# Patient Record
Sex: Female | Born: 2000 | Race: Black or African American | Hispanic: No | Marital: Single
Health system: Midwestern US, Community
[De-identification: ages and names within clinical notes are randomized; demographics above are authoritative.]

## PROBLEM LIST (undated history)

## (undated) ENCOUNTER — Inpatient Hospital Stay (HOSPITAL_COMMUNITY): Payer: Self-pay

## (undated) DIAGNOSIS — R519 Headache, unspecified: Secondary | ICD-10-CM

## (undated) DIAGNOSIS — O133 Gestational [pregnancy-induced] hypertension without significant proteinuria, third trimester: Secondary | ICD-10-CM

## (undated) DIAGNOSIS — O26893 Other specified pregnancy related conditions, third trimester: Secondary | ICD-10-CM

## (undated) DIAGNOSIS — J45909 Unspecified asthma, uncomplicated: Secondary | ICD-10-CM

## (undated) HISTORY — PX: WISDOM TOOTH EXTRACTION: SHX21

---
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
## 2018-02-06 NOTE — SANE Note (Signed)
ON 02/06/2018, AT APPROXIMATELY 1515 HOURS, I RECEIVED AN EMAIL FROM BRYAN (FROM THE [facility]), ADVISING THAT THE PT'S 23 DAY REGIMEN OF GENVOYA HAD BEEN RETURNED.    BRYAN FURTHER ADVISED THAT THE SHIPPING COMMENTS STATED:  "THE RECEIVER DOES NOT WANT THE PRODUCT AND REFUSED THE DELIVERY. /THE PACKAGE WILL BE RETURNED TO THE SENDER."  BRYAN ADVISED THAT THE CLAIM TO ADVANCING ACCESS & THE PT'S INSURANCE PLAN HAD BEEN REVERSED.

## 2018-06-30 NOTE — SANE Note (Signed)
06/30/2018. The patient file was requested by Detective DJ Benotti and was sent immediately after.

## 2018-11-29 ENCOUNTER — Inpatient Hospital Stay (HOSPITAL_COMMUNITY)
Admission: AD | Admit: 2018-11-29 | Discharge: 2018-11-29 | Disposition: A | Discharge status: Home / Self Care | Payer: PRIVATE HEALTH INSURANCE | Attending: Obstetrics and Gynecology | Admitting: Obstetrics and Gynecology

## 2018-11-29 ENCOUNTER — Other Ambulatory Visit: Payer: Self-pay

## 2018-11-29 ENCOUNTER — Encounter (HOSPITAL_COMMUNITY): Payer: Self-pay | Admitting: *Deleted

## 2018-11-29 DIAGNOSIS — R51 Headache: Secondary | ICD-10-CM | POA: Diagnosis present

## 2018-11-29 DIAGNOSIS — Z113 Encounter for screening for infections with a predominantly sexual mode of transmission: Secondary | ICD-10-CM | POA: Diagnosis not present

## 2018-11-29 DIAGNOSIS — N3001 Acute cystitis with hematuria: Secondary | ICD-10-CM | POA: Diagnosis not present

## 2018-11-29 DIAGNOSIS — R42 Dizziness and giddiness: Secondary | ICD-10-CM | POA: Diagnosis present

## 2018-11-29 DIAGNOSIS — Z3202 Encounter for pregnancy test, result negative: Secondary | ICD-10-CM | POA: Diagnosis not present

## 2018-11-29 DIAGNOSIS — R5383 Other fatigue: Secondary | ICD-10-CM | POA: Diagnosis present

## 2018-11-29 HISTORY — DX: Unspecified asthma, uncomplicated: J45.909

## 2018-11-29 LAB — CBC
HEMATOCRIT: 39.5 % (ref 36.0–46.0)
Hemoglobin: 12.6 g/dL (ref 12.0–15.0)
MCH: 26.5 pg (ref 26.0–34.0)
MCHC: 31.9 g/dL (ref 30.0–36.0)
MCV: 83 fL (ref 80.0–100.0)
NRBC: 0 % (ref 0.0–0.2)
PLATELETS: 212 10*3/uL (ref 150–400)
RBC: 4.76 MIL/uL (ref 3.87–5.11)
RDW: 16.7 % — AB (ref 11.5–15.5)
WBC: 7.6 10*3/uL (ref 4.0–10.5)

## 2018-11-29 LAB — URINALYSIS, ROUTINE W REFLEX MICROSCOPIC
BACTERIA UA: NONE SEEN
Bilirubin Urine: NEGATIVE
Glucose, UA: NEGATIVE mg/dL
Ketones, ur: NEGATIVE mg/dL
Nitrite: POSITIVE — AB
PROTEIN: 100 mg/dL — AB
SPECIFIC GRAVITY, URINE: 1.024 (ref 1.005–1.030)
WBC, UA: >50 WBC/hpf — ABNORMAL HIGH (ref 0–5)
pH: 6 (ref 5.0–8.0)

## 2018-11-29 LAB — WET PREP, GENITAL
Sperm: NONE SEEN
Trich, Wet Prep: NONE SEEN
Yeast Wet Prep HPF POC: NONE SEEN

## 2018-11-29 LAB — POCT PREGNANCY, URINE: PREG TEST UR: NEGATIVE

## 2018-11-29 MED ORDER — SULFAMETHOXAZOLE-TRIMETHOPRIM 800-160 MG PO TABS: 1.0000 | ORAL_TABLET | Freq: Two times a day (BID) | ORAL | 0 refills | Status: DC

## 2018-11-29 MED FILL — SULFAMETHOXAZOLE-TMP DS TAB: 800-160 | 3 days supply | Qty: 6 | Fill #0

## 2018-11-29 NOTE — MAU Note (Signed)
Missed period for 11 days.  Is feeling really really weak.  Has had nausea that wakes her out of her sleep. Has had really really bad headaches.  Did a test prior to missing period, it was neg.

## 2018-11-29 NOTE — MAU Note (Signed)
Discussed discharge and follow up with PCP or GYN for future care. Signature pad not working and d/c signature paper didn't print. Pt verbalized understanding of d/c and follow up.

## 2018-11-29 NOTE — Discharge Instructions (Signed)

## 2018-11-29 NOTE — MAU Provider Note (Signed)
Chief Complaint: Possible Pregnancy; Dizziness; Fatigue; and Headache   First Provider Initiated Contact with Patient 11/29/18 1412     SUBJECTIVE HPI: Leslie Schmidt is a 17 y.o. non pregnant patient who presents to Maternity Admissions reporting abdominal cramping, nausea, & amenorrhea. Was on OCPs but discontinued them last month. States per her APP, she is 11 days late for her period. Is sexually active with 1 partner x 3 months & does not routinely use condoms.  She reports increased urinary frequency & suprapubic pain for the last several days. Denies dysuria, fever, flank pain, or hematuria. Denies vaginal discharge, dyspareunia, or postcoital bleeding.  She started spotting when she arrived to MAU and thinks she may have started her period just now. Reports nausea & headaches and thinks it's due to starting her period.    Location: suprapubic Quality: cramping Severity: 5/10 on pain scale Duration: 3 days Timing: intermittent Modifying factors: nothing makes better or worse Associated signs and symptoms: nausea  Past Medical History:  Diagnosis Date  . Asthma    OB History  No obstetric history on file.   Past Surgical History:  Procedure Laterality Date  . WISDOM TOOTH EXTRACTION     Social History   Socioeconomic History  . Marital status: Single    Spouse name: Not on file  . Number of children: Not on file  . Years of education: Not on file  . Highest education level: Not on file  Occupational History  . Not on file  Social Needs  . Financial resource strain: Not on file  . Food insecurity:    Worry: Not on file    Inability: Not on file  . Transportation needs:    Medical: Not on file    Non-medical: Not on file  Tobacco Use  . Smoking status: Never Smoker  . Smokeless tobacco: Never Used  Substance and Sexual Activity  . Alcohol use: No    Frequency: Never  . Drug use: Not Currently    Types: Marijuana  . Sexual activity: Yes    Birth  control/protection: Condom    Comment: stopped taking BCP, not consistant with condoms  Lifestyle  . Physical activity:    Days per week: Not on file    Minutes per session: Not on file  . Stress: Not on file  Relationships  . Social connections:    Talks on phone: Not on file    Gets together: Not on file    Attends religious service: Not on file    Active member of club or organization: Not on file    Attends meetings of clubs or organizations: Not on file    Relationship status: Not on file  . Intimate partner violence:    Fear of current or ex partner: Not on file    Emotionally abused: Not on file    Physically abused: Not on file    Forced sexual activity: Not on file  Other Topics Concern  . Not on file  Social History Narrative  . Not on file   No family history on file. No current facility-administered medications on file prior to encounter.    No current outpatient medications on file prior to encounter.   No Known Allergies  I have reviewed patient's Past Medical Hx, Surgical Hx, Family Hx, Social Hx, medications and allergies.   Review of Systems  Constitutional: Negative.   Gastrointestinal: Positive for abdominal pain and nausea. Negative for constipation, diarrhea and vomiting.  Genitourinary: Positive for frequency,  menstrual problem and urgency. Negative for dyspareunia, dysuria, flank pain, hematuria and vaginal discharge.  Neurological: Positive for headaches.    OBJECTIVE Patient Vitals for the past 24 hrs:  BP Temp Temp src Pulse Resp SpO2 Height Weight  11/29/18 1542 123/74 - - 69 16 - - -  11/29/18 1330 125/82 98.4 F (36.9 C) Oral 77 16 100 % 5\' 4"  (1.626 m) 58.2 kg   Constitutional: Well-developed, well-nourished female in no acute distress.  Cardiovascular: normal rate & rhythm, no murmur Respiratory: normal rate and effort. Lung sounds clear throughout GI: Abd soft, non-tender, Pos BS x 4. No guarding or rebound tenderness MS: Extremities  nontender, no edema, normal ROM Neurologic: Alert and oriented x 4.  GU:     SPECULUM EXAM: NEFG, small amount of dark red blood. Cervix pink/smooth  BIMANUAL: No CMT. uterus normal size, no adnexal tenderness or masses.    LAB RESULTS Results for orders placed or performed during the hospital encounter of 11/29/18 (from the past 24 hour(s))  Urinalysis, Routine w reflex microscopic     Status: Abnormal   Collection Time: 11/29/18  1:56 PM  Result Value Ref Range   Color, Urine YELLOW YELLOW   APPearance CLOUDY (A) CLEAR   Specific Gravity, Urine 1.024 1.005 - 1.030   pH 6.0 5.0 - 8.0   Glucose, UA NEGATIVE NEGATIVE mg/dL   Hgb urine dipstick SMALL (A) NEGATIVE   Bilirubin Urine NEGATIVE NEGATIVE   Ketones, ur NEGATIVE NEGATIVE mg/dL   Protein, ur 161100 (A) NEGATIVE mg/dL   Nitrite POSITIVE (A) NEGATIVE   Leukocytes, UA LARGE (A) NEGATIVE   RBC / HPF 11-20 0 - 5 RBC/hpf   WBC, UA >50 (H) 0 - 5 WBC/hpf   Bacteria, UA NONE SEEN NONE SEEN   Squamous Epithelial / LPF 0-5 0 - 5   WBC Clumps PRESENT    Mucus PRESENT   Pregnancy, urine POC     Status: None   Collection Time: 11/29/18  2:00 PM  Result Value Ref Range   Preg Test, Ur NEGATIVE NEGATIVE  CBC     Status: Abnormal   Collection Time: 11/29/18  2:36 PM  Result Value Ref Range   WBC 7.6 4.0 - 10.5 K/uL   RBC 4.76 3.87 - 5.11 MIL/uL   Hemoglobin 12.6 12.0 - 15.0 g/dL   HCT 09.639.5 04.536.0 - 40.946.0 %   MCV 83.0 80.0 - 100.0 fL   MCH 26.5 26.0 - 34.0 pg   MCHC 31.9 30.0 - 36.0 g/dL   RDW 81.116.7 (H) 91.411.5 - 78.215.5 %   Platelets 212 150 - 400 K/uL   nRBC 0.0 0.0 - 0.2 %  Wet prep, genital     Status: Abnormal   Collection Time: 11/29/18  3:12 PM  Result Value Ref Range   Yeast Wet Prep HPF POC NONE SEEN NONE SEEN   Trich, Wet Prep NONE SEEN NONE SEEN   Clue Cells Wet Prep HPF POC PRESENT (A) NONE SEEN   WBC, Wet Prep HPF POC MODERATE (A) NONE SEEN   Sperm NONE SEEN     IMAGING No results found.  MAU COURSE Orders Placed This  Encounter  Procedures  . Wet prep, genital  . Urinalysis, Routine w reflex microscopic  . RPR  . HIV Antibody (routine testing w rflx)  . CBC  . Pregnancy, urine POC  . Discharge patient   Meds ordered this encounter  Medications  . sulfamethoxazole-trimethoprim (BACTRIM DS,SEPTRA DS) 800-160 MG tablet  Sig: Take 1 tablet by mouth 2 (two) times daily.    Dispense:  6 tablet    Refill:  0    Order Specific Question:   Supervising Provider    Answer:   Samara SnidePRATT, TANYA S [[id number]]    MDM UPT negative STD screening per patient U/a suspicious for UTI & pt with new onset suprapubic pain & urinary frequency. Pt afebrile and no CVAT.   ASSESSMENT 1. Acute cystitis with hematuria   2. Negative pregnancy test   3. Screen for STD (sexually transmitted disease)     PLAN Discharge home in stable condition. Rx bactrim GC/CT, HIV, RPR pending Establish with PCP   Allergies as of 11/29/2018   No Known Allergies     Medication List    STOP taking these medications   elvitegravir-cobicistat-emtricitabine-tenofovir 150-150-200-10 MG Tabs tablet Commonly known as:  GENVOYA     TAKE these medications   sulfamethoxazole-trimethoprim 800-160 MG tablet Commonly known as:  BACTRIM DS,SEPTRA DS Take 1 tablet by mouth 2 (two) times daily.        Judeth HornLawrence, Christien Frankl, NP 11/29/2018  6:16 PM

## 2018-11-29 NOTE — Unmapped (Signed)
Chief Complaint: Possible Pregnancy; Dizziness; Fatigue; and Headache   First Provider Initiated Contact with Patient 11/29/18 1412     SUBJECTIVE HPI: Kylie [name] is a 17 y.o. non pregnant patient who presents to [facility] reporting abdominal cramping, nausea, & amenorrhea. Was on OCPs but discontinued them last month. States per her APP, she is 11 days late for her period. Is sexually active with 1 partner x 3 months & does not routinely use condoms.  She reports increased urinary frequency & suprapubic pain for the last several days. Denies dysuria, fever, flank pain, or hematuria. Denies vaginal discharge, dyspareunia, or postcoital bleeding.  She started spotting when she arrived to [facility] and thinks she may have started her period just now. Reports nausea & headaches and thinks it's due to starting her period.    Location: suprapubic Quality: cramping Severity: 5/10 on pain scale Duration: 3 days Timing: intermittent Modifying factors: nothing makes better or worse Associated signs and symptoms: nausea  Past Medical History:  Diagnosis Date  . Asthma    OB History  No obstetric history on file.   Past Surgical History:  Procedure Laterality Date  . WISDOM TOOTH EXTRACTION     Social History   Socioeconomic History  . Marital status: Single    Spouse name: Not on file  . Number of children: Not on file  . Years of education: Not on file  . Highest education level: Not on file  Occupational History  . Not on file  Social Needs  . Financial resource strain: Not on file  . Food insecurity:    Worry: Not on file    Inability: Not on file  . Transportation needs:    Medical: Not on file    Non-medical: Not on file  Tobacco Use  . Smoking status: Never Smoker  . Smokeless tobacco: Never Used  Substance and Sexual Activity  . Alcohol use: No    Frequency: Never  . Drug use: Not Currently    Types: Marijuana  . Sexual activity: Yes    Birth  control/protection: Condom    Comment: stopped taking BCP, not consistant with condoms  Lifestyle  . Physical activity:    Days per week: Not on file    Minutes per session: Not on file  . Stress: Not on file  Relationships  . Social connections:    Talks on phone: Not on file    Gets together: Not on file    Attends religious service: Not on file    Active member of club or organization: Not on file    Attends meetings of clubs or organizations: Not on file    Relationship status: Not on file  . Intimate partner violence:    Fear of current or ex partner: Not on file    Emotionally abused: Not on file    Physically abused: Not on file    Forced sexual activity: Not on file  Other Topics Concern  . Not on file  Social History Narrative  . Not on file   No family history on file. No current facility-administered medications on file prior to encounter.    No current outpatient medications on file prior to encounter.   No Known Allergies  I have reviewed patient's Past Medical Hx, Surgical Hx, Family Hx, Social Hx, medications and allergies.   Review of Systems  Constitutional: Negative.   Gastrointestinal: Positive for abdominal pain and nausea. Negative for constipation, diarrhea and vomiting.  Genitourinary: Positive for frequency,  menstrual problem and urgency. Negative for dyspareunia, dysuria, flank pain, hematuria and vaginal discharge.  Neurological: Positive for headaches.    OBJECTIVE Patient Vitals for the past 24 hrs:  BP Temp Temp src Pulse Resp SpO2 Height Weight  11/29/18 1542 123/74 - - 69 16 - - -  11/29/18 1330 125/82 98.4 F (36.9 C) Oral 77 16 100 % 5\' 4"  (1.626 m) 58.2 kg   Constitutional: Well-developed, well-nourished female in no acute distress.  Cardiovascular: normal rate & rhythm, no murmur Respiratory: normal rate and effort. Lung sounds clear throughout GI: Abd soft, non-tender, Pos BS x 4. No guarding or rebound tenderness MS: Extremities  nontender, no edema, normal ROM Neurologic: Alert and oriented x 4.  GU:     SPECULUM EXAM: NEFG, small amount of dark red blood. Cervix pink/smooth  BIMANUAL: No CMT. uterus normal size, no adnexal tenderness or masses.    LAB RESULTS Results for orders placed or performed during the hospital encounter of 11/29/18 (from the past 24 hour(s))  Urinalysis, Routine w reflex microscopic     Status: Abnormal   Collection Time: 11/29/18  1:56 PM  Result Value Ref Range   Color, Urine YELLOW YELLOW   APPearance CLOUDY (A) CLEAR   Specific Gravity, Urine 1.024 1.005 - 1.030   pH 6.0 5.0 - 8.0   Glucose, UA NEGATIVE NEGATIVE mg/dL   Hgb urine dipstick SMALL (A) NEGATIVE   Bilirubin Urine NEGATIVE NEGATIVE   Ketones, ur NEGATIVE NEGATIVE mg/dL   Protein, ur 161100 (A) NEGATIVE mg/dL   Nitrite POSITIVE (A) NEGATIVE   Leukocytes, UA LARGE (A) NEGATIVE   RBC / HPF 11-20 0 - 5 RBC/hpf   WBC, UA >50 (H) 0 - 5 WBC/hpf   Bacteria, UA NONE SEEN NONE SEEN   Squamous Epithelial / LPF 0-5 0 - 5   WBC Clumps PRESENT    Mucus PRESENT   Pregnancy, urine POC     Status: None   Collection Time: 11/29/18  2:00 PM  Result Value Ref Range   Preg Test, Ur NEGATIVE NEGATIVE  CBC     Status: Abnormal   Collection Time: 11/29/18  2:36 PM  Result Value Ref Range   WBC 7.6 4.0 - 10.5 K/uL   RBC 4.76 3.87 - 5.11 MIL/uL   Hemoglobin 12.6 12.0 - 15.0 g/dL   HCT 09.639.5 04.536.0 - 40.946.0 %   MCV 83.0 80.0 - 100.0 fL   MCH 26.5 26.0 - 34.0 pg   MCHC 31.9 30.0 - 36.0 g/dL   RDW 81.116.7 (H) 91.411.5 - 78.215.5 %   Platelets 212 150 - 400 K/uL   nRBC 0.0 0.0 - 0.2 %  Wet prep, genital     Status: Abnormal   Collection Time: 11/29/18  3:12 PM  Result Value Ref Range   Yeast Wet Prep HPF POC NONE SEEN NONE SEEN   Trich, Wet Prep NONE SEEN NONE SEEN   Clue Cells Wet Prep HPF POC PRESENT (A) NONE SEEN   WBC, Wet Prep HPF POC MODERATE (A) NONE SEEN   Sperm NONE SEEN     IMAGING No results found.  MAU COURSE Orders Placed This  Encounter  Procedures  . Wet prep, genital  . Urinalysis, Routine w reflex microscopic  . RPR  . HIV Antibody (routine testing w rflx)  . CBC  . Pregnancy, urine POC  . Discharge patient   Meds ordered this encounter  Medications  . sulfamethoxazole-trimethoprim (BACTRIM DS,SEPTRA DS) 800-160 MG tablet  Sig: Take 1 tablet by mouth 2 (two) times daily.    Dispense:  6 tablet    Refill:  0    Order Specific Question:   Supervising Provider    Answer:   Samara SnidePRATT, TANYA S [[id number]]    MDM UPT negative STD screening per patient U/a suspicious for UTI & pt with new onset suprapubic pain & urinary frequency. Pt afebrile and no CVAT.   ASSESSMENT 1. Acute cystitis with hematuria   2. Negative pregnancy test   3. Screen for STD (sexually transmitted disease)     PLAN Discharge home in stable condition. Rx bactrim GC/CT, HIV, RPR pending Establish with PCP   Allergies as of 11/29/2018   No Known Allergies     Medication List    STOP taking these medications   elvitegravir-cobicistat-emtricitabine-tenofovir 150-150-200-10 MG Tabs tablet Commonly known as:  GENVOYA     TAKE these medications   sulfamethoxazole-trimethoprim 800-160 MG tablet Commonly known as:  BACTRIM DS,SEPTRA DS Take 1 tablet by mouth 2 (two) times daily.        Judeth HornLawrence, Erin, NP 11/29/2018  6:16 PM

## 2018-11-30 LAB — HIV ANTIBODY (ROUTINE TESTING W REFLEX): HIV Screen 4th Generation wRfx: NONREACTIVE

## 2018-11-30 LAB — RPR: RPR Ser Ql: NONREACTIVE

## 2018-12-02 LAB — GC/CHLAMYDIA PROBE AMP (~~LOC~~) NOT AT ARMC
Chlamydia: POSITIVE — AB
NEISSERIA GONORRHEA: NEGATIVE

## 2018-12-03 ENCOUNTER — Telehealth: Payer: Self-pay | Admitting: Student

## 2018-12-03 DIAGNOSIS — A749 Chlamydial infection, unspecified: Secondary | ICD-10-CM

## 2018-12-03 MED ORDER — AZITHROMYCIN 500 MG PO TABS: 1000.0000 mg | ORAL_TABLET | Freq: Once | ORAL | 0 refills | Status: AC

## 2018-12-03 MED FILL — AZITHROMYCIN 500 MG TABLET: 500 | 1 days supply | Qty: 2 | Fill #0

## 2018-12-03 NOTE — Telephone Encounter (Addendum)
Leslie Schmidt tested positive for  Chlamydia. Patient was called by RN and allergies and pharmacy confirmed. Rx sent to pharmacy of choice.   Judeth HornLawrence, Sheneka Schrom, NP 12/03/2018 1:06 PM       ----- Message from Kathe BectonLori S Berdik, RN sent at 12/02/2018  5:15 PM EST ----- This patient tested positive for :  CHLAMYDIA  She "has NKDA", I have informed the patient of her results and confirmed her pharmacy is correct in her chart. Please send Rx.   Thank you,   Kathe BectonBerdik, Lori S, RN   [facility]

## 2019-02-26 ENCOUNTER — Inpatient Hospital Stay (HOSPITAL_COMMUNITY)
Admission: AD | Admit: 2019-02-26 | Discharge: 2019-02-26 | Disposition: A | Discharge status: Home / Self Care | Payer: Medicaid - Out of State | Attending: Obstetrics and Gynecology | Admitting: Obstetrics and Gynecology

## 2019-02-26 ENCOUNTER — Other Ambulatory Visit: Payer: Self-pay

## 2019-02-26 ENCOUNTER — Encounter (HOSPITAL_COMMUNITY): Payer: Self-pay | Admitting: Student

## 2019-02-26 ENCOUNTER — Inpatient Hospital Stay (HOSPITAL_COMMUNITY): Payer: Medicaid - Out of State

## 2019-02-26 DIAGNOSIS — O219 Vomiting of pregnancy, unspecified: Secondary | ICD-10-CM | POA: Insufficient documentation

## 2019-02-26 DIAGNOSIS — O99611 Diseases of the digestive system complicating pregnancy, first trimester: Secondary | ICD-10-CM

## 2019-02-26 DIAGNOSIS — R109 Unspecified abdominal pain: Secondary | ICD-10-CM

## 2019-02-26 DIAGNOSIS — K5900 Constipation, unspecified: Secondary | ICD-10-CM | POA: Insufficient documentation

## 2019-02-26 DIAGNOSIS — R1030 Lower abdominal pain, unspecified: Secondary | ICD-10-CM | POA: Insufficient documentation

## 2019-02-26 DIAGNOSIS — O3680X0 Pregnancy with inconclusive fetal viability, not applicable or unspecified: Secondary | ICD-10-CM

## 2019-02-26 DIAGNOSIS — Z3A01 Less than 8 weeks gestation of pregnancy: Secondary | ICD-10-CM

## 2019-02-26 DIAGNOSIS — O26891 Other specified pregnancy related conditions, first trimester: Secondary | ICD-10-CM

## 2019-02-26 DIAGNOSIS — J45909 Unspecified asthma, uncomplicated: Secondary | ICD-10-CM | POA: Insufficient documentation

## 2019-02-26 DIAGNOSIS — Z79899 Other long term (current) drug therapy: Secondary | ICD-10-CM | POA: Insufficient documentation

## 2019-02-26 LAB — URINALYSIS, ROUTINE W REFLEX MICROSCOPIC
BILIRUBIN URINE: NEGATIVE
Glucose, UA: NEGATIVE mg/dL
Hgb urine dipstick: NEGATIVE
Ketones, ur: NEGATIVE mg/dL
LEUKOCYTE UA: NEGATIVE
NITRITE: NEGATIVE
Protein, ur: NEGATIVE mg/dL
Specific Gravity, Urine: 1.023 (ref 1.005–1.030)
pH: 7 (ref 5.0–8.0)

## 2019-02-26 LAB — WET PREP, GENITAL
Sperm: NONE SEEN
Trich, Wet Prep: NONE SEEN
Yeast Wet Prep HPF POC: NONE SEEN

## 2019-02-26 LAB — HCG, QUANTITATIVE, PREGNANCY: hCG, Beta Chain, Quant, S: 4802 m[IU]/mL — ABNORMAL HIGH (ref <5)

## 2019-02-26 LAB — CBC
HCT: 36.5 % (ref 36.0–46.0)
Hemoglobin: 11.7 g/dL — ABNORMAL LOW (ref 12.0–15.0)
MCH: 27.5 pg (ref 26.0–34.0)
MCHC: 32.1 g/dL (ref 30.0–36.0)
MCV: 85.9 fL (ref 80.0–100.0)
Platelets: 209 10*3/uL (ref 150–400)
RBC: 4.25 MIL/uL (ref 3.87–5.11)
RDW: 13.9 % (ref 11.5–15.5)
WBC: 6.8 10*3/uL (ref 4.0–10.5)
nRBC: 0 % (ref 0.0–0.2)

## 2019-02-26 LAB — ABO/RH: ABO/RH(D): O POS

## 2019-02-26 LAB — POCT PREGNANCY, URINE: Preg Test, Ur: POSITIVE — AB

## 2019-02-26 MED ORDER — METOCLOPRAMIDE HCL 10 MG PO TABS: 10.0000 mg | ORAL_TABLET | Freq: Three times a day (TID) | ORAL | 0 refills | Status: AC | PRN

## 2019-02-26 MED FILL — METOCLOPRAMIDE 10 MG TABLET: 10 | 10 days supply | Qty: 30 | Fill #0

## 2019-02-26 NOTE — Discharge Instructions (Signed)
Constipation, Adult Constipation is when a person has fewer bowel movements in a week than normal, has difficulty having a bowel movement, or has stools that are dry, hard, or larger than normal. Constipation may be caused by an underlying condition. It may become worse with age if a person takes certain medicines and does not take in enough fluids. Follow these instructions at home: Eating and drinking   Eat foods that have a lot of fiber, such as fresh fruits and vegetables, whole grains, and beans.  Limit foods that are high in fat, low in fiber, or overly processed, such as french fries, hamburgers, cookies, candies, and soda.  Drink enough fluid to keep your urine clear or pale yellow. General instructions  Exercise regularly or as told by your health care provider.  Go to the restroom when you have the urge to go. Do not hold it in.  Take over-the-counter and prescription medicines only as told by your health care provider. These include any fiber supplements.  Practice pelvic floor retraining exercises, such as deep breathing while relaxing the lower abdomen and pelvic floor relaxation during bowel movements.  Watch your condition for any changes.  Keep all follow-up visits as told by your health care provider. This is important. Contact a health care provider if:  You have pain that gets worse.  You have a fever.  You do not have a bowel movement after 4 days.  You vomit.  You are not hungry.  You lose weight.  You are bleeding from the anus.  You have thin, pencil-like stools. Get help right away if:  You have a fever and your symptoms suddenly get worse.  You leak stool or have blood in your stool.  Your abdomen is bloated.  You have severe pain in your abdomen.  You feel dizzy or you faint. This information is not intended to replace advice given to you by your health care provider. Make sure you discuss any questions you have with your health care  provider. Document Released: 08/25/2004 Document Revised: 06/16/2016 Document Reviewed: 05/17/2016 Elsevier Interactive Patient Education  2019 [employer] Morning Sickness  Morning sickness is when a woman feels nauseous during pregnancy. This nauseous feeling may or may not come with vomiting. It often occurs in the morning, but it can be a problem at any time of day. Morning sickness is most common during the first trimester. In some cases, it may continue throughout pregnancy. Although morning sickness is unpleasant, it is usually harmless unless the woman develops severe and continual vomiting (hyperemesis gravidarum), a condition that requires more intense treatment. What are the causes? The exact cause of this condition is not known, but it seems to be related to normal hormonal changes that occur in pregnancy. What increases the risk? You are more likely to develop this condition if:  You experienced nausea or vomiting before your pregnancy.  You had morning sickness during a previous pregnancy.  You are pregnant with more than one baby, such as twins. What are the signs or symptoms? Symptoms of this condition include:  Nausea.  Vomiting. How is this diagnosed? This condition is usually diagnosed based on your signs and symptoms. How is this treated? In many cases, treatment is not needed for this condition. Making some changes to what you eat may help to control symptoms. Your health care provider may also prescribe or recommend:  Vitamin B6 supplements.  Anti-nausea medicines.  Ginger. Follow these instructions at home: Medicines  Take over-the-counter and  prescription medicines only as told by your health care provider. Do not use any prescription, over-the-counter, or herbal medicines for morning sickness without first talking with your health care provider.  Taking multivitamins before getting pregnant can prevent or decrease the severity of morning sickness in  most women. Eating and drinking  Eat a piece of dry toast or crackers before getting out of bed in the morning.  Eat 5 or 6 small meals a day.  Eat dry and bland foods, such as rice or a baked potato. Foods that are high in carbohydrates are often helpful.  Avoid greasy, fatty, and spicy foods.  Have someone cook for you if the smell of any food causes nausea and vomiting.  If you feel nauseous after taking prenatal vitamins, take the vitamins at night or with a snack.  Snack on protein foods between meals if you are hungry. Nuts, yogurt, and cheese are good options.  Drink fluids throughout the day.  Try ginger ale made with real ginger, ginger tea made from fresh grated ginger, or ginger candies. General instructions  Do not use any products that contain nicotine or tobacco, such as cigarettes and e-cigarettes. If you need help quitting, ask your health care provider.  Get an air purifier to keep the air in your house free of odors.  Get plenty of fresh air.  Try to avoid odors that trigger your nausea.  Consider trying these methods to help relieve symptoms: ? Wearing an acupressure wristband. These wristbands are often worn for seasickness. ? Acupuncture. Contact a health care provider if:  Your home remedies are not working and you need medicine.  You feel dizzy or light-headed.  You are losing weight. Get help right away if:  You have persistent and uncontrolled nausea and vomiting.  You faint.  You have severe pain in your abdomen. Summary  Morning sickness is when a woman feels nauseous during pregnancy. This nauseous feeling may or may not come with vomiting.  Morning sickness is most common during the first trimester.  It often occurs in the morning, but it can be a problem at any time of day.  In many cases, treatment is not needed for this condition. Making some changes to what you eat may help to control symptoms. This information is not intended  to replace advice given to you by your health care provider. Make sure you discuss any questions you have with your health care provider. Document Released: 01/18/2007 Document Revised: 12/30/2016 Document Reviewed: 12/30/2016 Elsevier Interactive Patient Education  2019 [employer]      Safe Medications in Pregnancy   Acne: Benzoyl Peroxide Salicylic Acid  Backache/Headache: Tylenol: 2 regular strength every 4 hours OR              2 Extra strength every 6 hours  Colds/Coughs/Allergies: Benadryl (alcohol free) 25 mg every 6 hours as needed Breath right strips Claritin Cepacol throat lozenges Chloraseptic throat spray Cold-Eeze- up to three times per day Cough drops, alcohol free Flonase (by prescription only) Guaifenesin Mucinex Robitussin DM (plain only, alcohol free) Saline nasal spray/drops Sudafed (pseudoephedrine) & Actifed ** use only after [redacted] weeks gestation and if you do not have high blood pressure Tylenol Vicks Vaporub Zinc lozenges Zyrtec   Constipation: Colace Ducolax suppositories Fleet enema Glycerin suppositories Metamucil Milk of magnesia Miralax Senokot Smooth move tea  Diarrhea: Kaopectate Imodium A-D  *NO pepto Bismol  Hemorrhoids: Anusol Anusol HC Preparation H Tucks  Indigestion: Tums Maalox Mylanta Zantac  Pepcid  Insomnia: Benadryl (alcohol free) 25mg  every 6 hours as needed Tylenol PM Unisom, no Gelcaps  Leg Cramps: Tums MagGel  Nausea/Vomiting:  Bonine Dramamine Emetrol Ginger extract Sea bands Meclizine  Nausea medication to take during pregnancy:  Unisom (doxylamine succinate 25 mg tablets) Take one tablet daily at bedtime. If symptoms are not adequately controlled, the dose can be increased to a maximum recommended dose of two tablets daily (1/2 tablet in the morning, 1/2 tablet mid-afternoon and one at bedtime). Vitamin B6 100mg  tablets. Take one tablet twice a day (up to 200 mg per day).  Skin  Rashes: Aveeno products Benadryl cream or 25mg  every 6 hours as needed Calamine Lotion 1% cortisone cream  Yeast infection: Gyne-lotrimin 7 Monistat 7  Gum/tooth pain: Anbesol  **If taking multiple medications, please check labels to avoid duplicating the same active ingredients **take medication as directed on the label ** Do not exceed 4000 mg of tylenol in 24 hours **Do not take medications that contain aspirin or ibuprofen

## 2019-02-26 NOTE — MAU Provider Note (Signed)
Chief Complaint: Nausea and Constipation   First Provider Initiated Contact with Patient 02/26/19 1221     SUBJECTIVE HPI: Leslie Schmidt is a 18 y.o. G1P0 at [redacted]w[redacted]d who presents to Maternity Admissions reporting n/v, constipation, & abdominal pain. Reports daily nausea, vomited once yesterday. Does not have nausea meds at home. Also reports issues with constipation. Had not had a BM in the last 4 days until this morning. Had not taking anything to tx her constipation but did eat ice cream because she is lactose intolerant.  Reports intermittent lower abdominal pain for the last several days. Denies dysuria, vaginal discharge, or vaginal bleeding.   Location: lower abdomen Quality: cramping Severity: 4/10 on pain scale Duration: 4 days Timing: intermittent Modifying factors: none Associated signs and symptoms: n/v, constipation  Past Medical History:  Diagnosis Date  . Asthma    OB History  Gravida Para Term Preterm AB Living  1            SAB TAB Ectopic Multiple Live Births               # Outcome Date GA Lbr Len/2nd Weight Sex Delivery Anes PTL Lv  1 Current            Past Surgical History:  Procedure Laterality Date  . WISDOM TOOTH EXTRACTION     Social History   Socioeconomic History  . Marital status: Single    Spouse name: Not on file  . Number of children: Not on file  . Years of education: Not on file  . Highest education level: Not on file  Occupational History  . Not on file  Social Needs  . Financial resource strain: Not on file  . Food insecurity:    Worry: Not on file    Inability: Not on file  . Transportation needs:    Medical: Not on file    Non-medical: Not on file  Tobacco Use  . Smoking status: Never Smoker  . Smokeless tobacco: Never Used  Substance and Sexual Activity  . Alcohol use: Not Currently    Frequency: Never    Comment: not since finding out she was pregnant  . Drug use: Not Currently    Types: Marijuana    Comment: last use summer  2019  . Sexual activity: Yes    Comment: stopped taking BCP, not consistant with condoms  Lifestyle  . Physical activity:    Days per week: Not on file    Minutes per session: Not on file  . Stress: Not on file  Relationships  . Social connections:    Talks on phone: Not on file    Gets together: Not on file    Attends religious service: Not on file    Active member of club or organization: Not on file    Attends meetings of clubs or organizations: Not on file    Relationship status: Not on file  . Intimate partner violence:    Fear of current or ex partner: Not on file    Emotionally abused: Not on file    Physically abused: Not on file    Forced sexual activity: Not on file  Other Topics Concern  . Not on file  Social History Narrative  . Not on file   History reviewed. No pertinent family history. No current facility-administered medications on file prior to encounter.    Current Outpatient Medications on File Prior to Encounter  Medication Sig Dispense Refill  . sulfamethoxazole-trimethoprim (BACTRIM DS,SEPTRA DS)  800-160 MG tablet Take 1 tablet by mouth 2 (two) times daily. 6 tablet 0   No Known Allergies  I have reviewed patient's Past Medical Hx, Surgical Hx, Family Hx, Social Hx, medications and allergies.   Review of Systems  Constitutional: Negative.   Gastrointestinal: Positive for abdominal pain, constipation, nausea and vomiting.  Genitourinary: Negative.     OBJECTIVE Patient Vitals for the past 24 hrs:  BP Temp Temp src Pulse Resp SpO2 Height Weight  02/26/19 1141 119/68 98.2 F (36.8 C) Oral 85 18 100 % - -  02/26/19 1100 112/71 97.9 F (36.6 C) Oral 90 18 100 % 5\' 3"  (1.6 m) 60.8 kg   Constitutional: Well-developed, well-nourished female in no acute distress.  Cardiovascular: normal rate & rhythm, no murmur Respiratory: normal rate and effort. Lung sounds clear throughout GI: Abd soft, non-tender, Pos BS x 4. No guarding or rebound  tenderness MS: Extremities nontender, no edema, normal ROM Neurologic: Alert and oriented x 4.    LAB RESULTS Results for orders placed or performed during the hospital encounter of 02/26/19 (from the past 24 hour(s))  Urinalysis, Routine w reflex microscopic     Status: Abnormal   Collection Time: 02/26/19 11:10 AM  Result Value Ref Range   Color, Urine YELLOW YELLOW   APPearance HAZY (A) CLEAR   Specific Gravity, Urine 1.023 1.005 - 1.030   pH 7.0 5.0 - 8.0   Glucose, UA NEGATIVE NEGATIVE mg/dL   Hgb urine dipstick NEGATIVE NEGATIVE   Bilirubin Urine NEGATIVE NEGATIVE   Ketones, ur NEGATIVE NEGATIVE mg/dL   Protein, ur NEGATIVE NEGATIVE mg/dL   Nitrite NEGATIVE NEGATIVE   Leukocytes,Ua NEGATIVE NEGATIVE  Pregnancy, urine POC     Status: Abnormal   Collection Time: 02/26/19 11:11 AM  Result Value Ref Range   Preg Test, Ur POSITIVE (A) NEGATIVE  Wet prep, genital     Status: Abnormal   Collection Time: 02/26/19 12:34 PM  Result Value Ref Range   Yeast Wet Prep HPF POC NONE SEEN NONE SEEN   Trich, Wet Prep NONE SEEN NONE SEEN   Clue Cells Wet Prep HPF POC PRESENT (A) NONE SEEN   WBC, Wet Prep HPF POC FEW (A) NONE SEEN   Sperm NONE SEEN   CBC     Status: Abnormal   Collection Time: 02/26/19 12:53 PM  Result Value Ref Range   WBC 6.8 4.0 - 10.5 K/uL   RBC 4.25 3.87 - 5.11 MIL/uL   Hemoglobin 11.7 (L) 12.0 - 15.0 g/dL   HCT 76.8 08.8 - 11.0 %   MCV 85.9 80.0 - 100.0 fL   MCH 27.5 26.0 - 34.0 pg   MCHC 32.1 30.0 - 36.0 g/dL   RDW 31.5 94.5 - 85.9 %   Platelets 209 150 - 400 K/uL   nRBC 0.0 0.0 - 0.2 %  ABO/Rh     Status: None   Collection Time: 02/26/19 12:53 PM  Result Value Ref Range   ABO/RH(D) O POS    No rh immune globuloin      NOT A RH IMMUNE GLOBULIN CANDIDATE, PT RH POSITIVE Performed at [facility], [address], [place], [place] [address]   hCG, quantitative, pregnancy     Status: Abnormal   Collection Time: 02/26/19 12:53 PM  Result Value Ref  Range   hCG, Beta Chain, Quant, S 4,802 (H) <5 mIU/mL    IMAGING US Ob Less Than 14 Weeks With Ob Transvaginal  Result Date: 02/26/2019  CLINICAL DATA:  Abdominal pain. Estimated gestational age of [redacted] weeks, 6 days by LMP. EXAM: OBSTETRIC <14 WK [place] AND TRANSVAGINAL OB US TECHNIQUE: Both transabdominal and transvaginal ultrasound examinations were performed for complete evaluation of the gestation as well as the maternal uterus, adnexal regions, and pelvic cul-de-sac. Transvaginal technique was performed to assess early pregnancy. COMPARISON:  None. FINDINGS: Intrauterine gestational sac: Single Yolk sac:  Not Visualized. Embryo:  Not Visualized. MSD: 4.9 mm   5 w   1 d Subchorionic hemorrhage:  None visualized. Maternal uterus/adnexae: Unremarkable. IMPRESSION: 1. Probable early intrauterine gestational sac, but no yolk sac, fetal pole, or cardiac activity yet visualized. Recommend follow-up quantitative B-HCG levels and follow-up US in 14 days to assess viability. This recommendation follows SRU consensus guidelines: Diagnostic Criteria for Nonviable Pregnancy Early in the First Trimester. Malva Limes Med 2013; 502:7741-28. 2. No acute abnormality. Electronically Signed   By: Obie Dredge M.D.   On: 02/26/2019 15:15    MAU COURSE Orders Placed This Encounter  Procedures  . Wet prep, genital  . US OB LESS THAN 14 WEEKS WITH OB TRANSVAGINAL  . Urinalysis, Routine w reflex microscopic  . CBC  . hCG, quantitative, pregnancy  . HIV Antibody (routine testing w rflx)  . Pregnancy, urine POC  . ABO/Rh  . Discharge patient   Meds ordered this encounter  Medications  . metoCLOPramide (REGLAN) 10 MG tablet    Sig: Take 1 tablet (10 mg total) by mouth every 8 (eight) hours as needed for nausea.    Dispense:  30 tablet    Refill:  0    Order Specific Question:   Supervising Provider    Answer:   Conan Bowens [[id number]]    MDM +UPT UA, wet prep, GC/chlamydia, CBC, ABO/Rh, quant hCG, and [place] today  to rule out ectopic pregnancy  Ultrasound shows empty IUGS, no YS, no adnexal mass. HCG >4000.  Will bring patient to office on Friday for repeat HCG. Given ectopic/SAB return precautions  U/a without signs of infection or dehydration. Will send home with rx for nausea.  Also reviewed treatment of constipation.   ASSESSMENT 1. Pregnancy of unknown anatomic location   2. Abdominal pain during pregnancy in first trimester   3. Nausea and vomiting during pregnancy prior to [redacted] weeks gestation   4. Constipation during pregnancy in first trimester     PLAN Discharge home in stable condition. SAB vs ectopic precautions  Allergies as of 02/26/2019   No Known Allergies     Medication List    STOP taking these medications   sulfamethoxazole-trimethoprim 800-160 MG tablet Commonly known as:  BACTRIM DS,SEPTRA DS     TAKE these medications   metoCLOPramide 10 MG tablet Commonly known as:  REGLAN Take 1 tablet (10 mg total) by mouth every 8 (eight) hours as needed for nausea.        Judeth Horn, NP 02/26/2019  3:29 PM

## 2019-02-26 NOTE — MAU Note (Signed)
Pt presents to MAU with c/o nausea/vomiting that started x 1 week ago and she states that she has also been constipated x 4 days. Her last bowel movement was today. She had +HPT last week, LMP 01/23/2019. Pt denies VB.

## 2019-02-27 LAB — HIV ANTIBODY (ROUTINE TESTING W REFLEX): HIV Screen 4th Generation wRfx: NONREACTIVE

## 2019-02-28 ENCOUNTER — Other Ambulatory Visit: Payer: Self-pay

## 2019-02-28 ENCOUNTER — Encounter: Payer: Self-pay | Admitting: *Deleted

## 2019-02-28 ENCOUNTER — Ambulatory Visit (INDEPENDENT_AMBULATORY_CARE_PROVIDER_SITE_OTHER): Payer: Self-pay | Admitting: *Deleted

## 2019-02-28 VITALS — Temp 98.9°F

## 2019-02-28 DIAGNOSIS — O3680X0 Pregnancy with inconclusive fetal viability, not applicable or unspecified: Secondary | ICD-10-CM

## 2019-02-28 LAB — BETA HCG QUANT (REF LAB): HCG QUANT: 7030 m[IU]/mL

## 2019-02-28 NOTE — Progress Notes (Signed)
Here for stat bhcg. Denies pain. Denies bleeding.States she thinks the pain was from constipation and now that she is not constipated , she is not having pain. Explained we will draw stat bhcg and have her wait in her car  for results due to coronovirus protocols.. Then when results are ready we will discuss with provider and then her. She voices understanding. Linda,RN    Reviewed results with Dr. Shawnie Pons. Informed patient inappropriate rise in bhcg; still concern for ectopic pregnancy and we advise repeat stat bhcg in MAU in 48 hours on Sunday. Support given. She voices understanding.  Linda,RN

## 2019-02-28 NOTE — Progress Notes (Signed)
Patient seen and assessed by nursing staff.  Agree with documentation and plan.  

## 2019-03-02 ENCOUNTER — Other Ambulatory Visit: Payer: Self-pay

## 2019-03-02 ENCOUNTER — Inpatient Hospital Stay (HOSPITAL_COMMUNITY)
Admission: AD | Admit: 2019-03-02 | Discharge: 2019-03-02 | Disposition: A | Discharge status: Home / Self Care | Payer: Self-pay | Attending: Family Medicine | Admitting: Family Medicine

## 2019-03-02 DIAGNOSIS — O3680X0 Pregnancy with inconclusive fetal viability, not applicable or unspecified: Secondary | ICD-10-CM | POA: Insufficient documentation

## 2019-03-02 DIAGNOSIS — J45909 Unspecified asthma, uncomplicated: Secondary | ICD-10-CM | POA: Insufficient documentation

## 2019-03-02 DIAGNOSIS — O26891 Other specified pregnancy related conditions, first trimester: Secondary | ICD-10-CM | POA: Insufficient documentation

## 2019-03-02 DIAGNOSIS — Z3A01 Less than 8 weeks gestation of pregnancy: Secondary | ICD-10-CM | POA: Insufficient documentation

## 2019-03-02 LAB — HCG, QUANTITATIVE, PREGNANCY: hCG, Beta Chain, Quant, S: 19905 m[IU]/mL — ABNORMAL HIGH (ref <5)

## 2019-03-02 NOTE — MAU Note (Signed)
Pt here for repeat Hcg. No pain, no bleeding.

## 2019-03-02 NOTE — MAU Provider Note (Signed)
History     CSN: [id number]  Arrival date and time: 03/02/19 1622   None     Chief Complaint  Patient presents with  . Labs Only   Leslie Schmidt is a 18 y.o. G1P0 at [redacted]w[redacted]d who presents today for FU HCG. First repeat HCG was just under 60% rise in approx 48 hours. She denies any pain or bleeding today.   Abdominal Pain  This is a new problem. The current episode started in the past 7 days. The problem has been resolved. The pain is at a severity of 0/10. Pertinent negatives include no dysuria or fever. Nothing aggravates the pain. The pain is relieved by nothing.    OB History    Gravida  1   Para      Term      Preterm      AB      Living        SAB      TAB      Ectopic      Multiple      Live Births              Past Medical History:  Diagnosis Date  . Asthma     Past Surgical History:  Procedure Laterality Date  . WISDOM TOOTH EXTRACTION      No family history on file.  Social History   Tobacco Use  . Smoking status: Never Smoker  . Smokeless tobacco: Never Used  Substance Use Topics  . Alcohol use: Not Currently    Frequency: Never    Comment: not since finding out she was pregnant  . Drug use: Not Currently    Types: Marijuana    Comment: last use summer 2019    Allergies: No Known Allergies  Medications Prior to Admission  Medication Sig Dispense Refill Last Dose  . metoCLOPramide (REGLAN) 10 MG tablet Take 1 tablet (10 mg total) by mouth every 8 (eight) hours as needed for nausea. (Patient not taking: Reported on 02/28/2019) 30 tablet 0 Not Taking    Review of Systems  Constitutional: Negative for diaphoresis and fever.  Gastrointestinal: Negative for abdominal pain.  Genitourinary: Negative for dysuria, pelvic pain and vaginal bleeding.   Physical Exam   Blood pressure 131/81, pulse (!) 103, temperature 98.6 F (37 C), temperature source Oral, resp. rate 16, weight 60.8 kg, last menstrual period 01/23/2019, SpO2 98  %.  Physical Exam  Nursing note and vitals reviewed. Constitutional: She is oriented to person, place, and time. She appears well-developed and well-nourished. No distress.  HENT:  Head: Normocephalic.  Cardiovascular: Normal rate.  Respiratory: Effort normal.  GI: Soft. There is no abdominal tenderness. There is no rebound.  Neurological: She is alert and oriented to person, place, and time.  Skin: Skin is warm and dry.  Psychiatric: She has a normal mood and affect.    Results for Leslie, Schmidt (MRN [id number]) as of 03/02/2019 17:52  Ref. Range 02/26/2019 12:53  HCG, Beta Chain, Quant, S Latest Ref Range: <5 mIU/mL 4,802 (H)  Results for Leslie, Schmidt (MRN [id number]) as of 03/02/2019 17:52  Ref. Range 02/28/2019 08:43  hCG Quant Latest Units: mIU/mL 7,030  Results for Leslie, Schmidt (MRN [id number]) as of 03/02/2019 17:52  Ref. Range 03/02/2019 16:44  HCG, Beta Chain, Quant, S Latest Ref Range: <5 mIU/mL 19,905 (H)   MAU Course  Procedures  MDM HCG is rising >60% in 48 hours. Will get [place] 14 days from first [place]. Order  placed.   Assessment and Plan   1. Pregnancy of unknown anatomic location    DC home Comfort measures reviewed  1stTrimester precautions  Bleeding precautions Ectopic precautions RX: none  Return to MAU as needed   Follow-up Information    [facility] Follow up.   Specialty:  Obstetrics and Gynecology Contact information: [address] [place] [address] [phone]       [facility] Follow up.

## 2019-03-02 NOTE — Discharge Instructions (Signed)

## 2019-03-04 LAB — GC/CHLAMYDIA PROBE AMP (~~LOC~~) NOT AT ARMC
CHLAMYDIA, DNA PROBE: NEGATIVE
Neisseria Gonorrhea: NEGATIVE

## 2019-03-12 ENCOUNTER — Ambulatory Visit (HOSPITAL_COMMUNITY): Payer: Self-pay

## 2019-06-01 ENCOUNTER — Encounter (HOSPITAL_COMMUNITY): Payer: Self-pay | Admitting: Emergency Medicine

## 2019-06-01 ENCOUNTER — Emergency Department (HOSPITAL_COMMUNITY)
Admission: EM | Admit: 2019-06-01 | Discharge: 2019-06-01 | Disposition: A | Discharge status: Home / Self Care | Payer: Self-pay | Attending: Emergency Medicine | Admitting: Emergency Medicine

## 2019-06-01 ENCOUNTER — Other Ambulatory Visit: Payer: Self-pay

## 2019-06-01 DIAGNOSIS — Z5321 Procedure and treatment not carried out due to patient leaving prior to being seen by health care provider: Secondary | ICD-10-CM | POA: Insufficient documentation

## 2019-06-01 DIAGNOSIS — R51 Headache: Secondary | ICD-10-CM | POA: Insufficient documentation
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
## 2019-09-01 IMAGING — US OBSTETRIC <14 WK US AND TRANSVAGINAL OB US
1 series · 15 of 28 positions shown · non-contrast
Comparison: None.

CLINICAL DATA: Abdominal pain. Estimated gestational age of 4
weeks, 6 days by LMP.

EXAM:
OBSTETRIC <14 WK US AND TRANSVAGINAL OB US
TECHNIQUE: Both transabdominal and transvaginal ultrasound examinations were
performed for complete evaluation of the gestation as well as the
maternal uterus, adnexal regions, and pelvic cul-de-sac.
Transvaginal technique was performed to assess early pregnancy.

[Series 1: obstetric <14 wk us and transvaginal ob us · 15 of 55 slices shown]
[im 1/55]
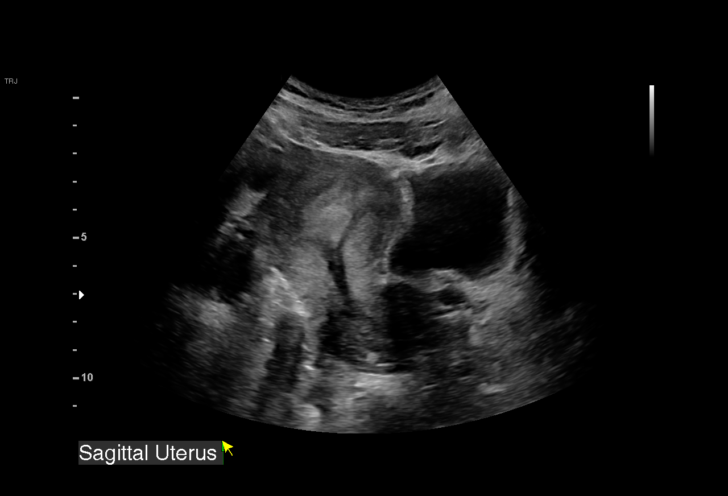
[im 5/55]
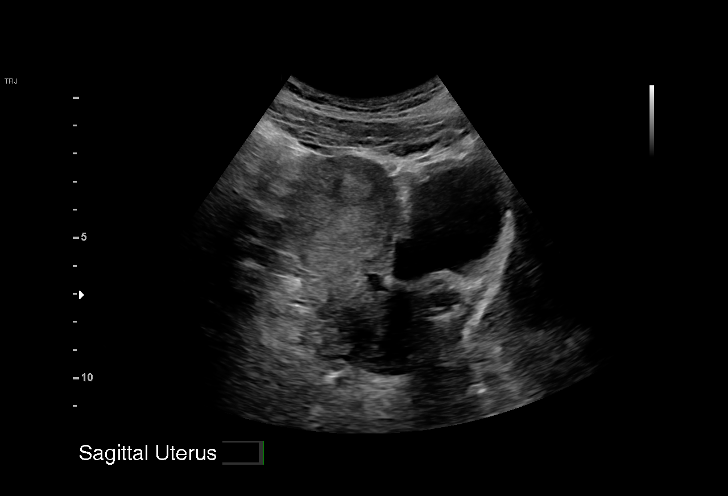
[im 9/55]
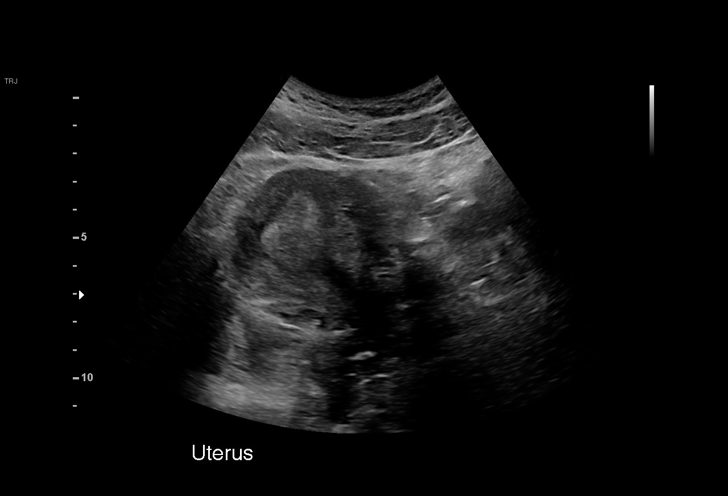
[im 13/55]
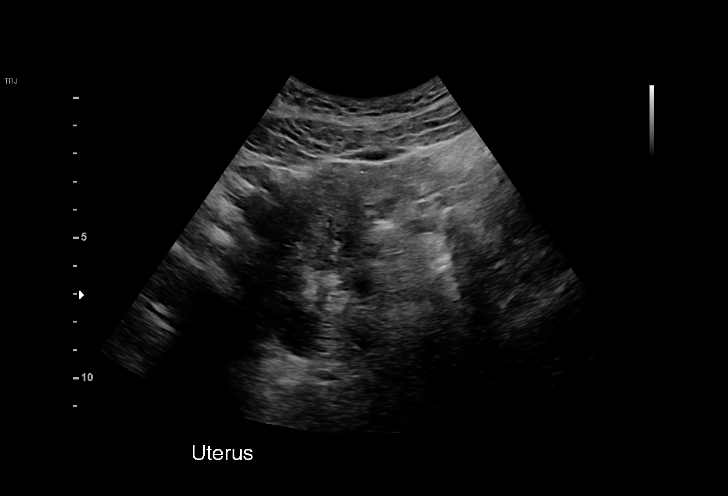
[im 17/55]
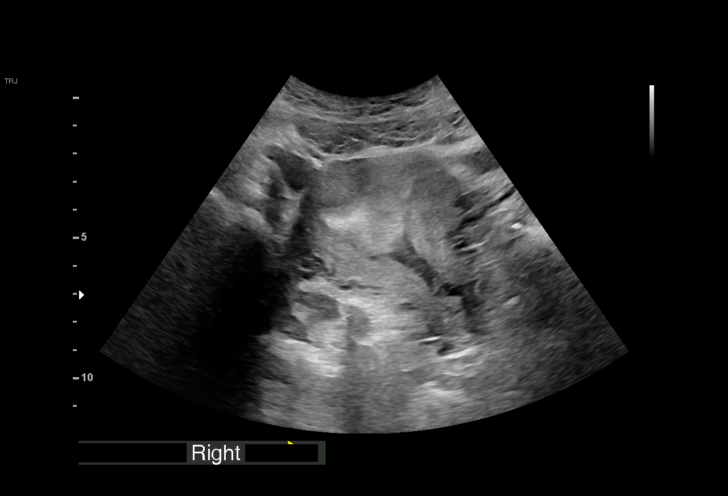
[im 21/55]
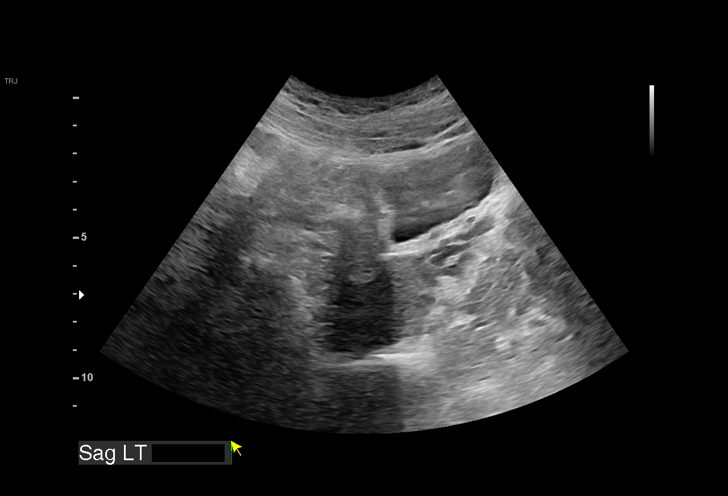
[im 25/55]
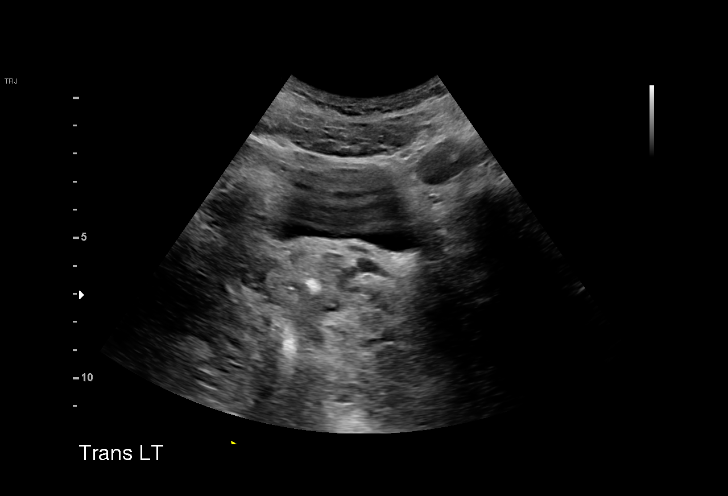
[im 29/55]
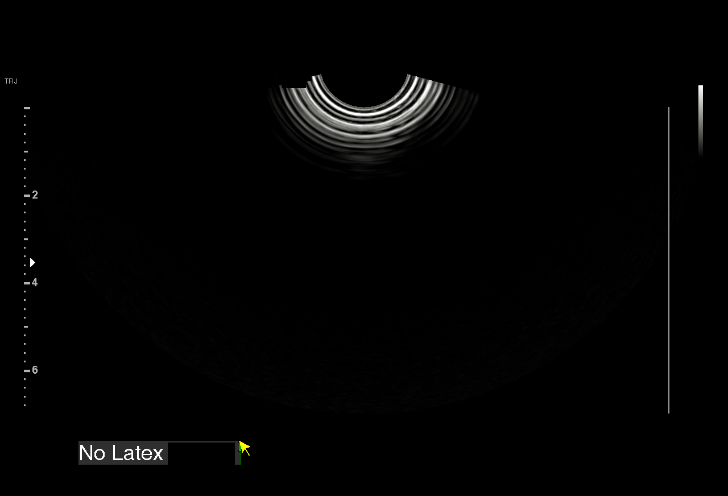
[im 31/55]
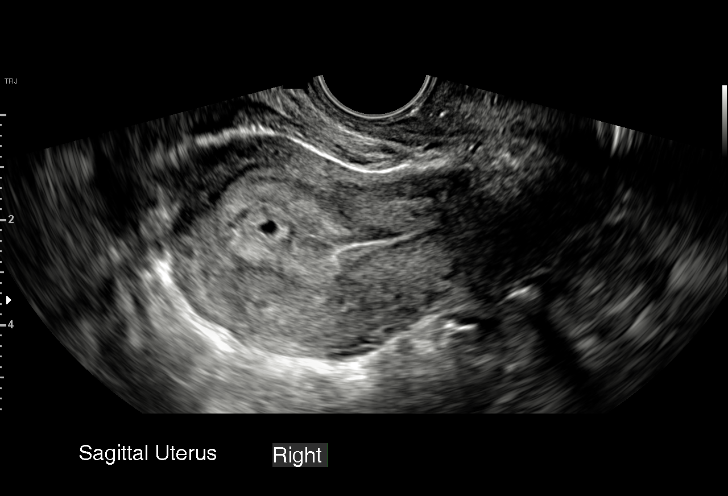
[im 35/55]
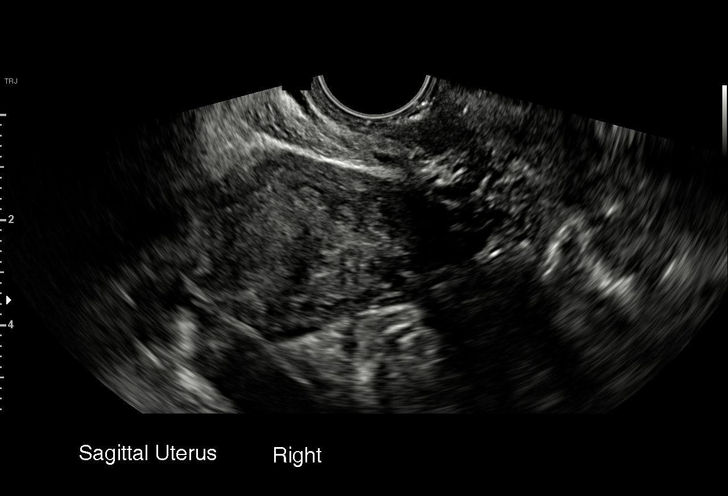
[im 39/55]
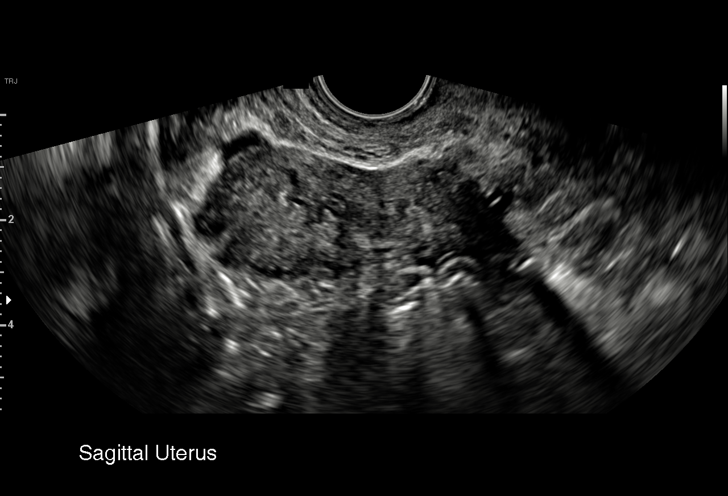
[im 43/55]
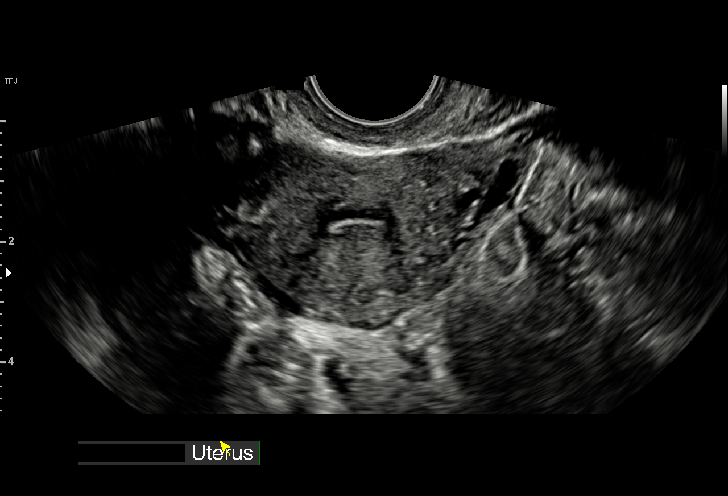
[im 47/55]
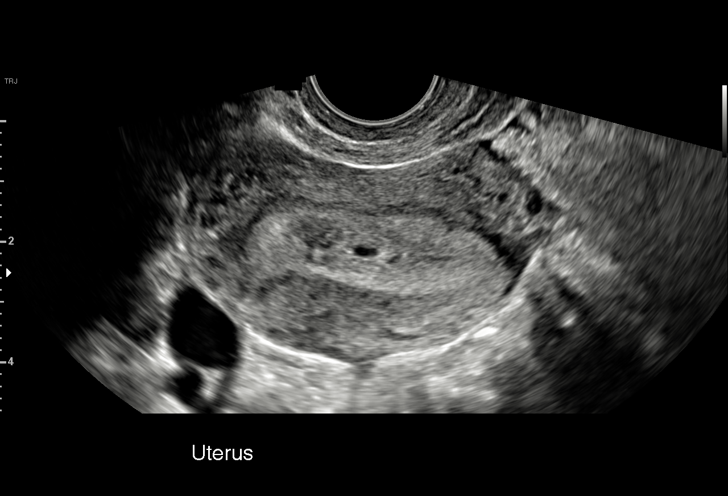
[im 51/55]
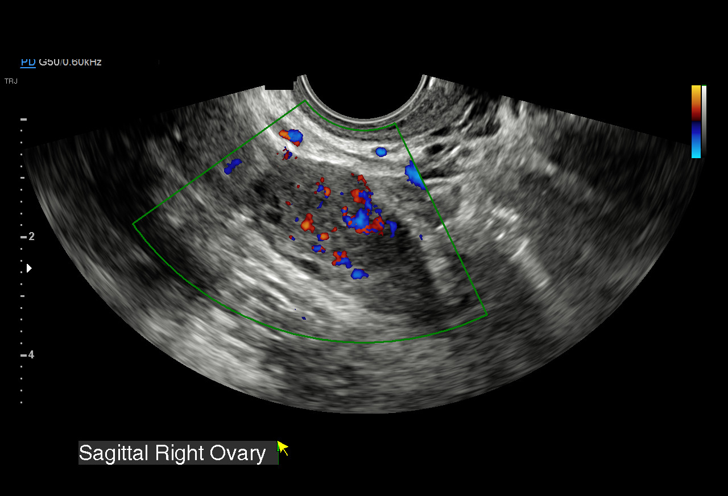
[im 55/55]
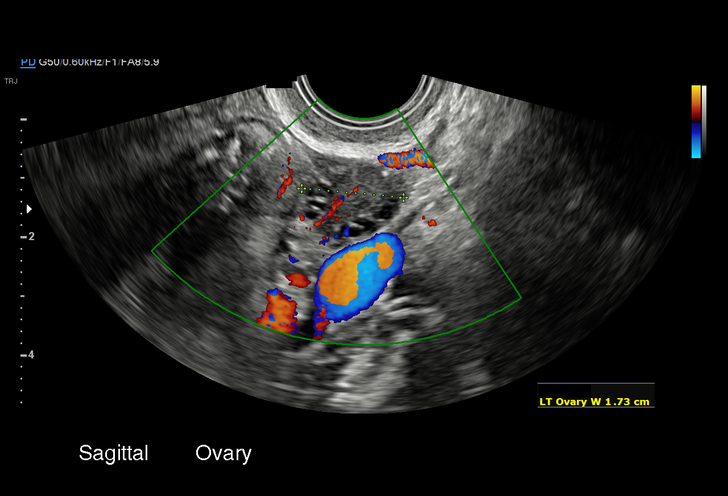

[15 of 28 positions shown; findings below may reference images not displayed]

FINDINGS: Intrauterine gestational sac: Single

Yolk sac:  Not Visualized.

Embryo:  Not Visualized.

MSD: 4.9 mm   5 w   1 d

Subchorionic hemorrhage:  None visualized.

Maternal uterus/adnexae: Unremarkable.
IMPRESSION: 1. Probable early intrauterine gestational sac, but no yolk sac,
fetal pole, or cardiac activity yet visualized. Recommend follow-up
quantitative B-HCG levels and follow-up US in 14 days to assess
viability. This recommendation follows SRU consensus guidelines:
Diagnostic Criteria for Nonviable Pregnancy Early in the First
Trimester. N Engl J Med 9372; [DATE].
2. No acute abnormality.

## 2019-09-26 ENCOUNTER — Encounter (HOSPITAL_COMMUNITY): Payer: Self-pay

## 2019-09-26 ENCOUNTER — Emergency Department (HOSPITAL_COMMUNITY): Payer: 59

## 2019-09-26 ENCOUNTER — Emergency Department (HOSPITAL_COMMUNITY)
Admission: EM | Admit: 2019-09-26 | Discharge: 2019-09-26 | Disposition: A | Discharge status: Home / Self Care | Payer: 59 | Attending: Emergency Medicine | Admitting: Emergency Medicine

## 2019-09-26 ENCOUNTER — Other Ambulatory Visit: Payer: Self-pay

## 2019-09-26 DIAGNOSIS — Y929 Unspecified place or not applicable: Secondary | ICD-10-CM | POA: Insufficient documentation

## 2019-09-26 DIAGNOSIS — Y999 Unspecified external cause status: Secondary | ICD-10-CM | POA: Diagnosis not present

## 2019-09-26 DIAGNOSIS — M542 Cervicalgia: Secondary | ICD-10-CM | POA: Insufficient documentation

## 2019-09-26 DIAGNOSIS — O9A211 Injury, poisoning and certain other consequences of external causes complicating pregnancy, first trimester: Secondary | ICD-10-CM | POA: Diagnosis not present

## 2019-09-26 DIAGNOSIS — J45909 Unspecified asthma, uncomplicated: Secondary | ICD-10-CM | POA: Diagnosis not present

## 2019-09-26 DIAGNOSIS — Z3A01 Less than 8 weeks gestation of pregnancy: Secondary | ICD-10-CM | POA: Insufficient documentation

## 2019-09-26 DIAGNOSIS — O468X1 Other antepartum hemorrhage, first trimester: Secondary | ICD-10-CM | POA: Diagnosis present

## 2019-09-26 DIAGNOSIS — M549 Dorsalgia, unspecified: Secondary | ICD-10-CM | POA: Insufficient documentation

## 2019-09-26 DIAGNOSIS — O418X10 Other specified disorders of amniotic fluid and membranes, first trimester, not applicable or unspecified: Secondary | ICD-10-CM

## 2019-09-26 DIAGNOSIS — O99511 Diseases of the respiratory system complicating pregnancy, first trimester: Secondary | ICD-10-CM | POA: Insufficient documentation

## 2019-09-26 DIAGNOSIS — Y939 Activity, unspecified: Secondary | ICD-10-CM | POA: Diagnosis not present

## 2019-09-26 LAB — WET PREP, GENITAL
Clue Cells Wet Prep HPF POC: NONE SEEN
Sperm: NONE SEEN
Trich, Wet Prep: NONE SEEN
Yeast Wet Prep HPF POC: NONE SEEN

## 2019-09-26 LAB — RH IG WORKUP (INCLUDES ABO/RH)
ABO/RH(D): O POS
Gestational Age(Wks): 8

## 2019-09-26 LAB — I-STAT BETA HCG BLOOD, ED (MC, WL, AP ONLY): I-stat hCG, quantitative: >2000 m[IU]/mL — ABNORMAL HIGH (ref <5)

## 2019-09-26 LAB — HCG, QUANTITATIVE, PREGNANCY: hCG, Beta Chain, Quant, S: 97312 m[IU]/mL — ABNORMAL HIGH (ref <5)

## 2019-09-26 MED ORDER — ACETAMINOPHEN 325 MG PO TABS
650.0000 mg | ORAL_TABLET | Freq: Once | ORAL | Status: AC
  Administered 2019-09-26: 650 mg via ORAL
  Filled 2019-09-26: qty 2

## 2019-09-26 MED ORDER — MORPHINE SULFATE (PF) 4 MG/ML IV SOLN: 4.0000 mg | Freq: Once | INTRAVENOUS | Status: DC

## 2019-09-26 MED ORDER — LIDOCAINE 5 % EX PTCH: 1.0000 | MEDICATED_PATCH | CUTANEOUS | 0 refills | Status: AC

## 2019-09-26 NOTE — ED Notes (Signed)
Pt. Aware of urine specimen. Will collect urine when pt. Voids. Nurse aware.  
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
## 2019-09-30 LAB — GC/CHLAMYDIA PROBE AMP (~~LOC~~) NOT AT ARMC
Chlamydia: NEGATIVE
Neisseria Gonorrhea: NEGATIVE

## 2020-03-31 IMAGING — US US OB COMP LESS 14 WK
1 series · 14 of 28 positions shown · non-contrast
Comparison: None.

CLINICAL DATA: Pelvic pain

EXAM:
OBSTETRIC <14 WK US AND TRANSVAGINAL OB US
TECHNIQUE: Both transabdominal and transvaginal ultrasound examinations were
performed for complete evaluation of the gestation as well as the
maternal uterus, adnexal regions, and pelvic cul-de-sac.
Transvaginal technique was performed to assess early pregnancy.

[Series 1: us ob comp less 14 wk · 14 of 106 slices shown]
[im 4/106]
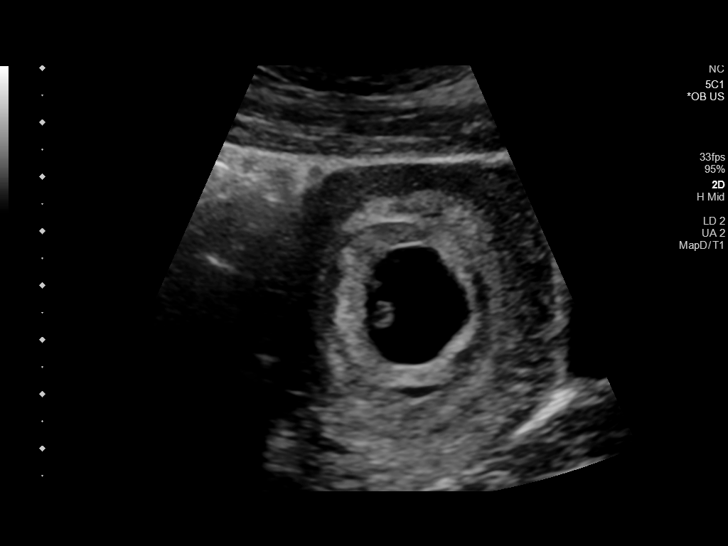
[im 12/106]
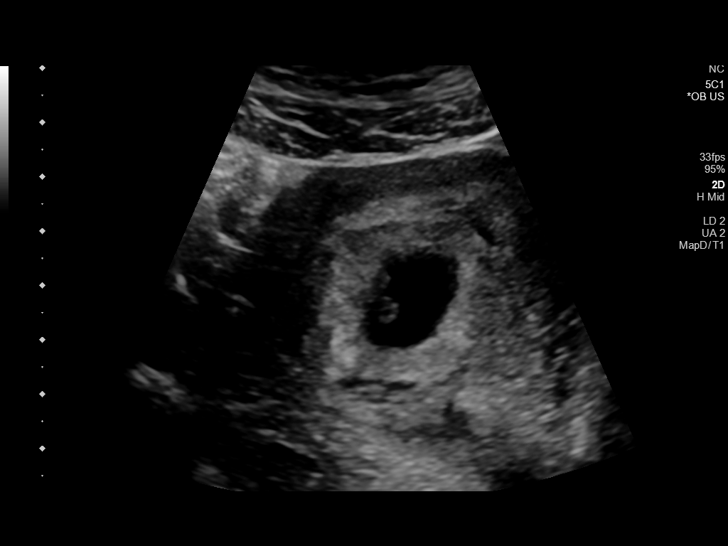
[im 20/106]
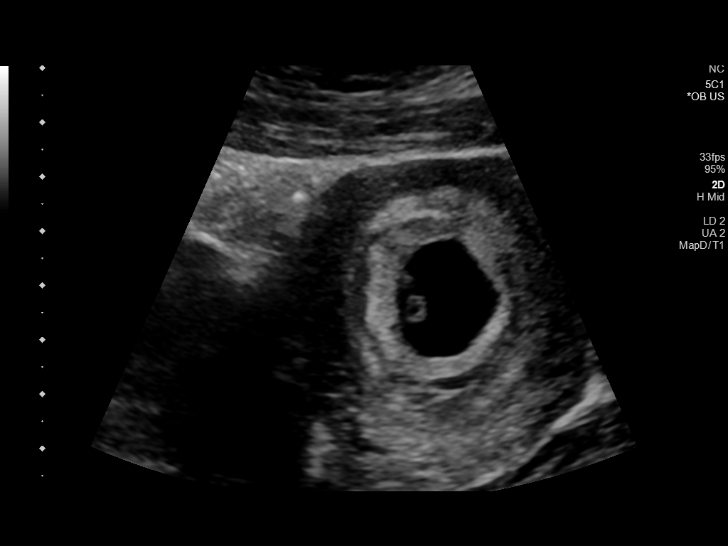
[im 28/106]
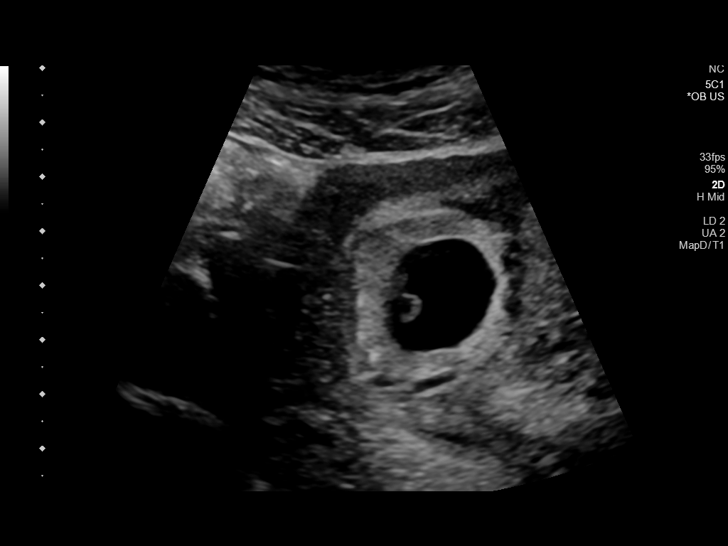
[im 36/106]
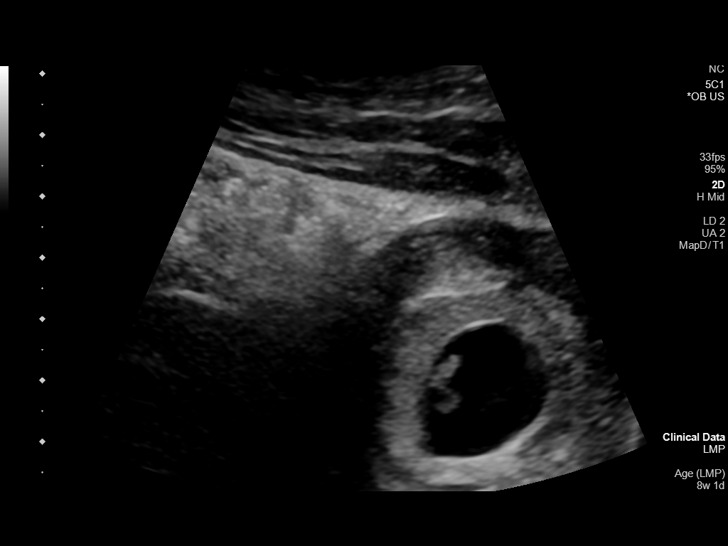
[im 43/106]
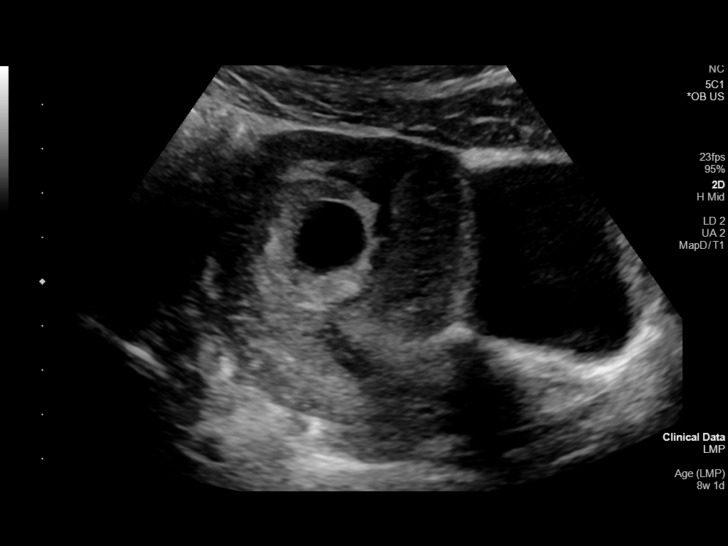
[im 51/106]
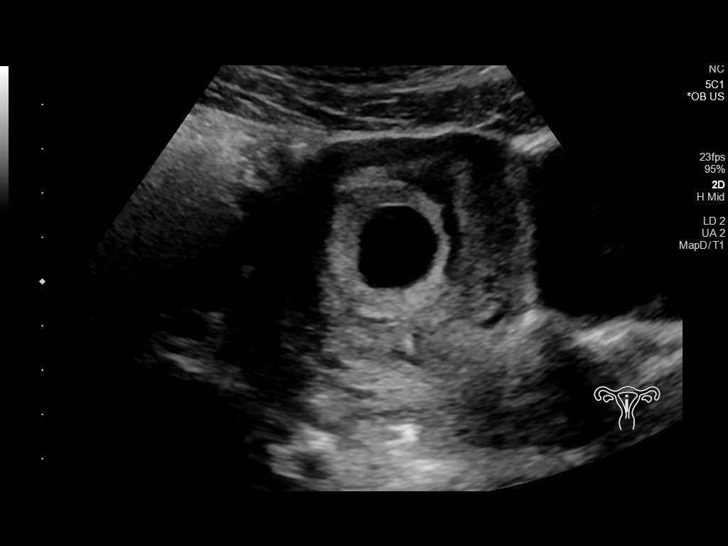
[im 59/106]
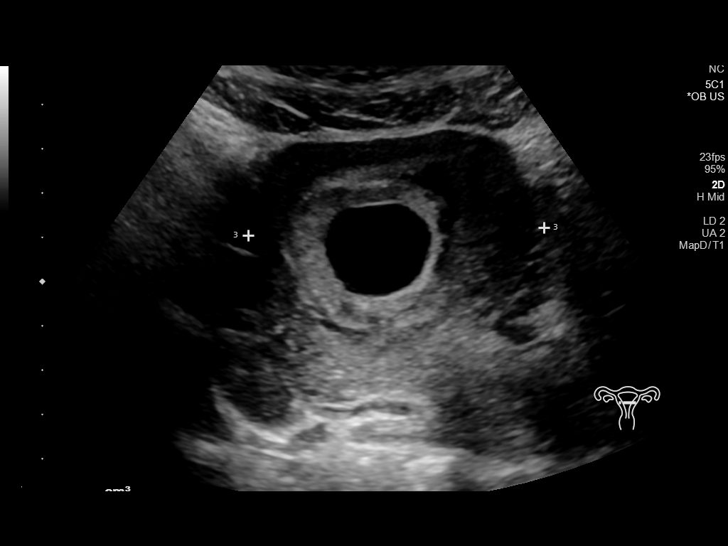
[im 67/106]
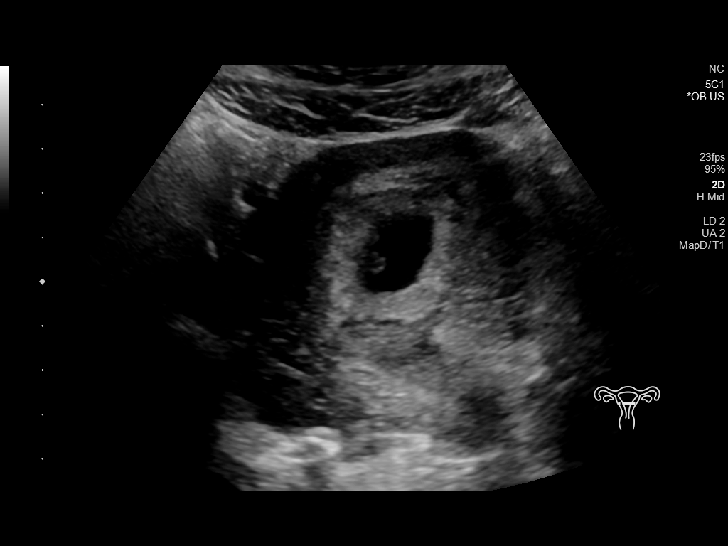
[im 74/106]
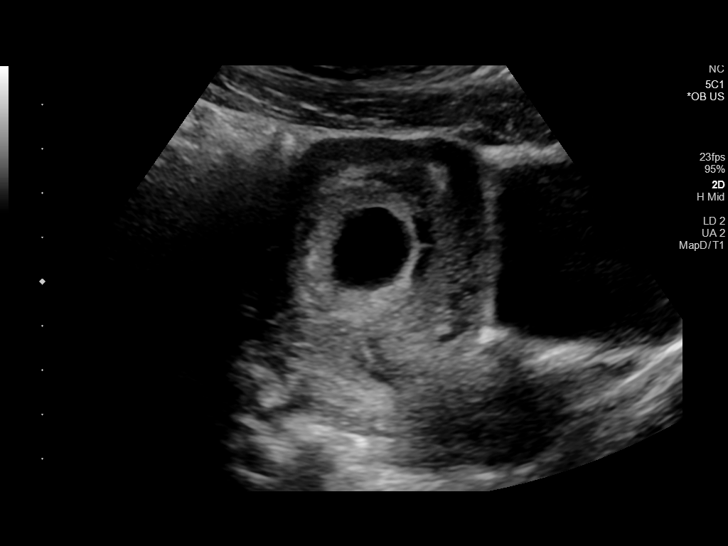
[im 82/106]
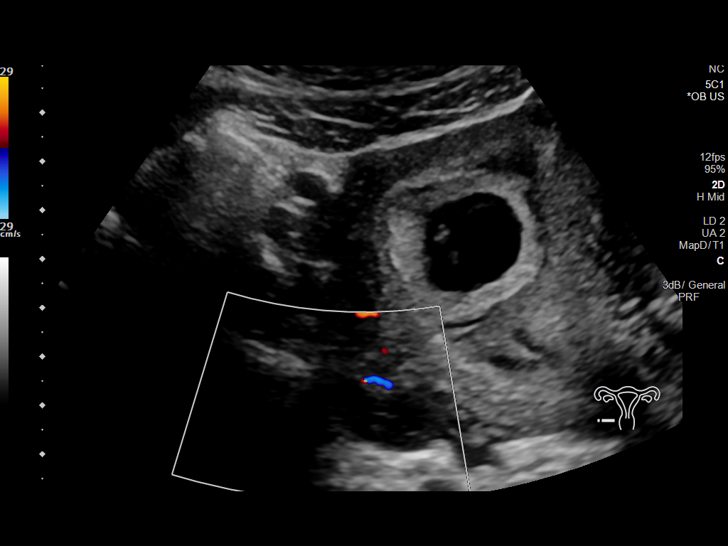
[im 90/106]
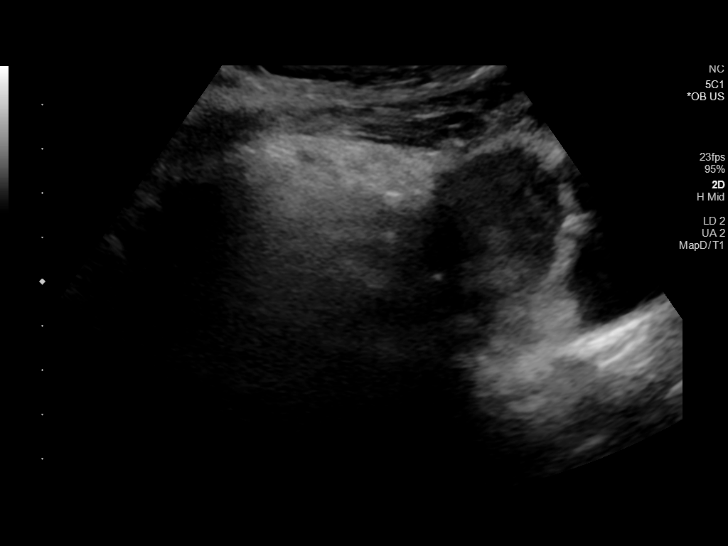
[im 98/106]
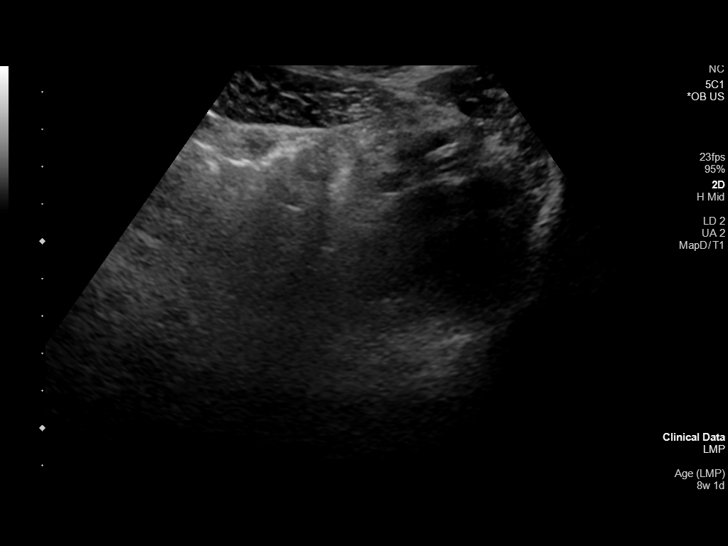
[im 106/106]
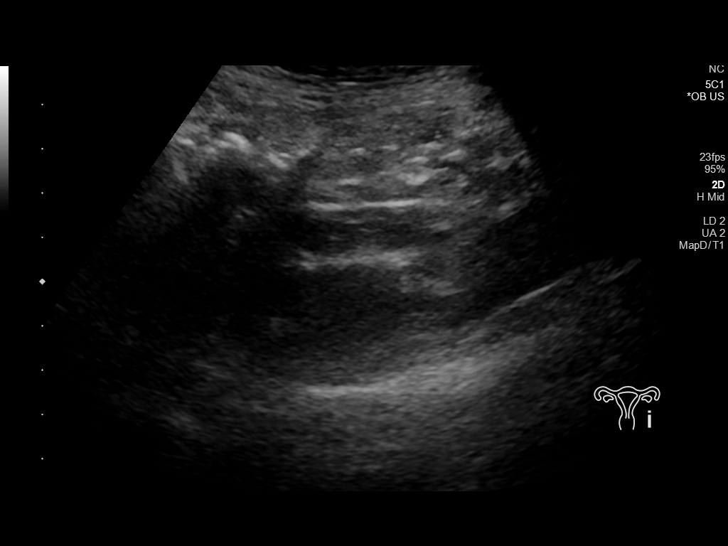

[14 of 28 positions shown; findings below may reference images not displayed]

FINDINGS: Intrauterine gestational sac: Visualized

Yolk sac:  Visualized

Embryo:  Visualized

Cardiac Activity: Visualized

Heart Rate: 120 bpm

CRL:  6 mm   6 w   3 d                  [place] EDC: May 18, 2020

Subchorionic hemorrhage: There is a subchorionic hemorrhage
measuring 2.5 x 0.7 cm.

Maternal uterus/adnexae: Cervical os is closed. Right ovary measures
1.9 x 2.7 x 1.5 cm. Left ovary is not well seen due to overlying
gas. There is no extrauterine pelvic or adnexal mass. No free pelvic
fluid.
IMPRESSION: Single live intrauterine gestation with estimated gestational age of
6+ weeks. Subchorionic hemorrhage measuring 2.5 x 0.7 cm noted.

Left ovary not well visualized. Right ovary appears unremarkable. No
extrauterine pelvic mass evident. No free pelvic fluid.

## 2022-05-02 ENCOUNTER — Inpatient Hospital Stay
Admit: 2022-05-02 | Discharge: 2022-05-02 | Disposition: A | Discharge status: Home / Self Care | Payer: PRIVATE HEALTH INSURANCE | Attending: Emergency Medicine

## 2022-05-02 DIAGNOSIS — B349 Viral infection, unspecified: Secondary | ICD-10-CM

## 2022-05-02 DIAGNOSIS — N3000 Acute cystitis without hematuria: Secondary | ICD-10-CM

## 2022-05-02 LAB — CBC WITH AUTO DIFFERENTIAL
Basophils: 0.1 % (ref 0–3)
Eosinophils: 0.3 % (ref 0–5)
Hematocrit: 35.2 % (ref 35.0–47.0)
Hemoglobin: 10.7 gm/dl — ABNORMAL LOW (ref 11.0–16.0)
Immature Granulocytes: 0.3 % (ref 0.0–3.0)
Lymphocytes: 1.9 % — ABNORMAL LOW (ref 28–48)
MCH: 24.4 pg — ABNORMAL LOW (ref 25.4–34.6)
MCHC: 30.4 gm/dl (ref 30.0–36.0)
MCV: 80.4 fL (ref 80.0–98.0)
MPV: 13.3 fL — ABNORMAL HIGH (ref 6.0–10.0)
Monocytes: 7.7 % (ref 1–13)
Neutrophils Segmented: 89.7 % — ABNORMAL HIGH (ref 34–64)
Nucleated RBCs: 0 (ref 0–0)
Platelets: 195 10*3/uL (ref 140–450)
RBC: 4.38 M/uL (ref 3.60–5.20)
RDW: 46.7 — ABNORMAL HIGH (ref 36.4–46.3)
WBC: 9 10*3/uL (ref 4.0–11.0)

## 2022-05-02 LAB — MICROSCOPIC URINALYSIS

## 2022-05-02 LAB — POCT URINALYSIS DIPSTICK
Bilirubin, Urine: NEGATIVE
Blood, Urine: NEGATIVE
Glucose, Ur: NEGATIVE mg/dl
Ketones, Urine: >=160 mg/dl — AB
Leukocyte Esterase, Urine: NEGATIVE
Nitrite, Urine: POSITIVE — AB
Protein, Urine: 30 mg/dl — AB
Specific Gravity, Urine: 1.025 (ref 1.005–1.030)
Urobilinogen, Urine: 0.2 EU/dl (ref 0.0–1.0)
pH, Urine: 6 (ref 5–9)

## 2022-05-02 LAB — COMPREHENSIVE METABOLIC PANEL
ALT: 7 U/L — ABNORMAL LOW (ref 10–49)
AST: 16 U/L (ref 0.0–33.9)
Albumin: 4 gm/dl (ref 3.4–5.0)
Alkaline Phosphatase: 69 U/L (ref 46–116)
Anion Gap: 10 mmol/L (ref 5–15)
BUN: 7 mg/dl — ABNORMAL LOW (ref 9–23)
CO2: 22 mEq/L (ref 20–31)
Calcium: 9.2 mg/dl (ref 8.7–10.4)
Chloride: 106 mEq/L (ref 98–107)
Creatinine: 0.8 mg/dl (ref 0.55–1.02)
GFR African American: >60
GFR Non-African American: >60
Glucose: 83 mg/dl (ref 74–106)
Potassium: 4.1 mEq/L (ref 3.5–5.1)
Sodium: 138 mEq/L (ref 136–145)
Total Bilirubin: 0.4 mg/dl (ref 0.30–1.20)
Total Protein: 7.7 gm/dl (ref 5.7–8.2)

## 2022-05-02 LAB — POC PREGNANCY UR-QUAL: Pregnancy, Urine: NEGATIVE

## 2022-05-02 MED ORDER — CEPHALEXIN 500 MG PO CAPS
500 MG | ORAL_CAPSULE | Freq: Two times a day (BID) | ORAL | 0 refills | Status: AC
Start: 2022-05-02 — End: 2022-05-07

## 2022-05-02 MED ORDER — PROCHLORPERAZINE EDISYLATE 10 MG/2ML IJ SOLN
10 MG/2ML | Freq: Once | INTRAMUSCULAR | Status: AC
Start: 2022-05-02 — End: 2022-05-02
  Administered 2022-05-02: 19:00:00 10 mg via INTRAVENOUS

## 2022-05-02 MED ORDER — KETOROLAC TROMETHAMINE 15 MG/ML IJ SOLN
15 MG/ML | INTRAMUSCULAR | Status: AC
Start: 2022-05-02 — End: 2022-05-02
  Administered 2022-05-02: 19:00:00 15 mg via INTRAVENOUS

## 2022-05-02 MED ORDER — ONDANSETRON HCL 4 MG/2ML IJ SOLN
4 MG/2ML | Freq: Once | INTRAMUSCULAR | Status: DC
Start: 2022-05-02 — End: 2022-05-02

## 2022-05-02 MED ORDER — SODIUM CHLORIDE 0.9 % IV BOLUS
0.9 % | Freq: Once | INTRAVENOUS | Status: AC
Start: 2022-05-02 — End: 2022-05-02
  Administered 2022-05-02: 20:00:00 1000 mL via INTRAVENOUS

## 2022-05-02 MED ORDER — IBUPROFEN 600 MG PO TABS
600 MG | ORAL | Status: AC
Start: 2022-05-02 — End: 2022-05-02
  Administered 2022-05-02: 20:00:00 600 mg via ORAL

## 2022-05-02 MED ORDER — SODIUM CHLORIDE 0.9 % IV BOLUS
0.9 % | Freq: Once | INTRAVENOUS | Status: AC
Start: 2022-05-02 — End: 2022-05-02
  Administered 2022-05-02: 19:00:00 1000 mL via INTRAVENOUS

## 2022-05-02 MED ORDER — ACETAMINOPHEN 10 MG/ML IV SOLN
10 MG/ML | Freq: Once | INTRAVENOUS | Status: AC
Start: 2022-05-02 — End: 2022-05-02
  Administered 2022-05-02: 20:00:00 1000 mg via INTRAVENOUS

## 2022-05-02 MED ORDER — DIPHENHYDRAMINE HCL 50 MG/ML IJ SOLN
50 MG/ML | INTRAMUSCULAR | Status: AC
Start: 2022-05-02 — End: 2022-05-02
  Administered 2022-05-02: 19:00:00 25 mg via INTRAVENOUS

## 2022-05-02 MED FILL — KETOROLAC TROMETHAMINE 15 MG/ML IJ SOLN: 15 MG/ML | INTRAMUSCULAR | Qty: 1

## 2022-05-02 MED FILL — PROCHLORPERAZINE EDISYLATE 10 MG/2ML IJ SOLN: 10 MG/2ML | INTRAMUSCULAR | Qty: 2

## 2022-05-02 MED FILL — ACETAMINOPHEN 10 MG/ML IV SOLN: 10 MG/ML | INTRAVENOUS | Qty: 100

## 2022-05-02 MED FILL — DIPHENHYDRAMINE HCL 50 MG/ML IJ SOLN: 50 MG/ML | INTRAMUSCULAR | Qty: 1

## 2022-05-02 NOTE — ED Triage Notes (Signed)
Patient presents to the ED for evaluation of N/V, body aches, and headaches X 2 days. Subjective fever reported however did not check the temperature at home.

## 2022-05-02 NOTE — ED Notes (Signed)
Pt c/o N/V, H/A, and body aches since 5 pm last night. Pt unable to sleep and unable to keep anything down overnight.     Levy Pupa, RN  05/02/22 [phone]

## 2022-05-02 NOTE — ED Provider Notes (Signed)
[facility]  Emergency Department Treatment Report    Patient: Kylie [name] Age: 21 y.o. Sex: female    Date of Birth: 02/05/2001 Admit Date: 05/02/2022 PCP: PROVIDER Tarri FullerUNKNOWN, AGPCNP   MRN: [id number]  CSN: [id number]     Room: ER41/ER41 Time Dictated: 5:54 PM        Chief Complaint   Headache, nausea, vomiting    History of Present Illness   21 y.o. female presents to the ED with complaints of headache, nausea, vomiting, which she reports began when she woke up this morning.  She reports that she has had similar symptoms previously and she received an IV and her symptoms improved.  She denies photophobia, phonophobia.  She reports that she has had headaches like this previously.  She denies fever.  She does report chills.  She denies chest pain, shortness of breath, abdominal pain, or dysuria.  She does not recall any relieving factors.    History obtained from: Patient, patient's father is at bedside  Review of Systems   As outlined in HPI    Past Medical/Surgical History   History reviewed. No pertinent past medical history.  History reviewed. No pertinent surgical history.    Social History     Social History     Socioeconomic History    Marital status: Single     Spouse name: None    Number of children: None    Years of education: None    Highest education level: None       Family History   No family history on file.    Current Medications     No current facility-administered medications for this encounter.     Current Outpatient Medications   Medication Sig Dispense Refill    cephALEXin (KEFLEX) 500 MG capsule Take 1 capsule by mouth 2 times daily for 5 days 10 capsule 0       Allergies   No Known Allergies    Physical Exam     ED Triage Vitals   Enc Vitals Group      BP 05/02/22 1248 124/89      Pulse 05/02/22 1248 (!) 120      Respirations 05/02/22 1248 18      Temp 05/02/22 1248 98.7 F (37.1 C)      Temp Source 05/02/22 1248 Oral      SpO2 05/02/22 1248 100 %      Weight - Scale 05/02/22 1249  64.4 kg      Height 05/02/22 1249 1.6 m      Head Circumference --       Peak Flow --       Pain Score --       Pain Loc --       Pain Edu? --       Excl. in GC? --          Physical Exam  Vitals and nursing note reviewed.   Constitutional:       General: She is not in acute distress.     Appearance: She is not ill-appearing.   HENT:      Head: Normocephalic and atraumatic.      Nose: Nose normal. No rhinorrhea.   Eyes:      General: No scleral icterus.     Extraocular Movements: Extraocular movements intact.   Cardiovascular:      Rate and Rhythm: Regular rhythm. Tachycardia present.      Heart sounds: No murmur heard.  Pulmonary:  Effort: Pulmonary effort is normal. No respiratory distress.      Breath sounds: No wheezing, rhonchi or rales.   Chest:      Chest wall: No tenderness.   Abdominal:      General: Abdomen is flat.      Palpations: Abdomen is soft.      Tenderness: There is no abdominal tenderness. There is no guarding or rebound.   Musculoskeletal:         General: No swelling or signs of injury.      Cervical back: Normal range of motion and neck supple. No rigidity.   Skin:     General: Skin is warm and dry.      Capillary Refill: Capillary refill takes less than 2 seconds.   Neurological:      General: No focal deficit present.      Mental Status: She is alert.      Cranial Nerves: No cranial nerve deficit.      Sensory: No sensory deficit.      Motor: No weakness.      Coordination: Coordination normal.   Psychiatric:         Mood and Affect: Mood normal.         Behavior: Behavior normal.         Diagnostic Studies   Lab:   Recent Results (from the past 12 hour(s))   CBC with Auto Differential    Collection Time: 05/02/22  2:05 PM   Result Value Ref Range    WBC 9.0 4.0 - 11.0 1000/mm3    RBC 4.38 3.60 - 5.20 M/uL    Hemoglobin 10.7 (L) 11.0 - 16.0 gm/dl    Hematocrit 72.0 94.7 - 47.0 %    MCV 80.4 80.0 - 98.0 fL    MCH 24.4 (L) 25.4 - 34.6 pg    MCHC 30.4 30.0 - 36.0 gm/dl    Platelets 096 283  - 450 1000/mm3    MPV 13.3 (H) 6.0 - 10.0 fL    RDW 46.7 (H) 36.4 - 46.3      Nucleated RBCs 0 0 - 0      Immature Granulocytes 0.3 0.0 - 3.0 %    Neutrophils Segmented 89.7 (H) 34 - 64 %    Lymphocytes 1.9 (L) 28 - 48 %    Monocytes 7.7 1 - 13 %    Eosinophils 0.3 0 - 5 %    Basophils 0.1 0 - 3 %   CMP    Collection Time: 05/02/22  2:05 PM   Result Value Ref Range    Potassium 4.1 3.5 - 5.1 mEq/L    Chloride 106 98 - 107 mEq/L    Sodium 138 136 - 145 mEq/L    CO2 22 20 - 31 mEq/L    Glucose 83 74 - 106 mg/dl    BUN 7 (L) 9 - 23 mg/dl    Creatinine 6.62 9.47 - 1.02 mg/dl    GFR African American >60.0      GFR Non-African American >60      Calcium 9.2 8.7 - 10.4 mg/dl    Anion Gap 10 5 - 15 mmol/L    AST 16.0 0.0 - 33.9 U/L    ALT 7 (L) 10 - 49 U/L    Alkaline Phosphatase 69 46 - 116 U/L    Total Bilirubin 0.40 0.30 - 1.20 mg/dl    Total Protein 7.7 5.7 - 8.2 gm/dl    Albumin 4.0 3.4 -  5.0 gm/dl   POCT Urinalysis no Micro    Collection Time: 05/02/22  2:36 PM   Result Value Ref Range    Glucose, Ur Negative NEGATIVE,Negative mg/dl    Bilirubin, Urine Negative NEGATIVE,Negative      Ketones, Urine >=160 (A) NEGATIVE,Negative mg/dl    Specific Gravity, Urine 1.025 1.005 - 1.030      Blood, Urine Negative NEGATIVE,Negative      pH, Urine 6.0 5 - 9      Protein, Urine 30 (A) NEGATIVE,Negative mg/dl    Urobilinogen, Urine 0.2 0.0 - 1.0 EU/dl    Nitrite, Urine Positive (A) NEGATIVE,Negative      Leukocyte Esterase, Urine Negative NEGATIVE,Negative      Color, UA Amber      Clarity, UA Slightly Cloudy     Microscopic Urinalysis    Collection Time: 05/02/22  2:36 PM   Result Value Ref Range    Squam Epithel, UA 10-14 /LPF    WBC, UA 1-4 /HPF    RBC, UA OCCASIONAL /HPF    BACTERIA, URINE 3+ /HPF   POC Pregnancy Urine Qual    Collection Time: 05/02/22  2:37 PM   Result Value Ref Range    Pregnancy, Urine negative NEGATIVE,Negative,negative         Imaging:    No orders to display       Impression and Management Plan    21 year old female is presenting to the ED for evaluation of headache, nausea, vomiting.  Neurologic exam is normal.  No red flag symptoms for headache.  Was not sudden onset.    DDX considered but not limited to viral syndrome, migraine, dehydration no suspicion for meningitis/encephalitis as the patient has no nuchal rigidity    Medical Decision Making   Patient is tachycardic.  Appears mildly dehydrated.  Plan for basic labs, IVF, and symptomatic management.    Labs show no evidence of leukocytosis.  The patient does have mild anemia.  Chemistry is within acceptable limits.  Patient is feeling better after ED management.  On recheck, the patient is febrile.  This likely causing her tachycardia.  She received another liter of fluids and her pulse decreased to 103.  Signs symptoms consistent with viral infection.  No other complaints and patient is very well-appearing.  UA does show evidence of mild infection.  Will send prescription for Keflex.    Patient comorbidities: see HPI    Data reviewed: Nursing notes, vital signs, prior visits, labs, imaging     External chart review performed: Reviewed-patient has history of menorrhagia.  Currently on the ring.    Tests and treatments considered: Considered imaging-no complaints of cough, chest pain, shortness of breath, or abdominal pain.    Social determinants affecting health: Reviewed and noncontributory to today's management    Shared decision making: Discussed all labs and imaging findings with patient. All questions were answered and patient is in agreement with plan for discharge.     Patient looks well, in no acute distress, and non-toxic appearing at time of discharge.   ED Course     ED Course as of 05/02/22 1754   Tue May 02, 2022   1654 HR down to 103 from 122. She is smiling, laughing, and requesting to be discharged so she can go eat. Feel that this is reasonable since she is so well-appearing. Strict return precautions given.  [RP]      ED Course User  Index  [RP] Fonda Kinder, DO  Final Diagnosis     1. Viral syndrome    2. Acute cystitis without hematuria        Disposition   DISPOSITION Decision To Discharge 05/02/2022 03:57:41 PM    [facility] EMERGENCY DEPT  [address] [place] [address]  [phone]    If symptoms worsen    Primary Care    Schedule an appointment as soon as possible for a visit       Discharge Medication List as of 05/02/2022  4:00 PM        START taking these medications    Details   cephALEXin (KEFLEX) 500 MG capsule Take 1 capsule by mouth 2 times daily for 5 days, Disp-10 capsule, R-0Normal                 Judithann Sauger, DO  May 02, 2022    My signature above authenticates this document and my orders, the final    diagnosis (es), discharge prescription (s), and instructions in the Epic    record.  If you have any questions please contact [phone].     Nursing notes have been reviewed by the physician/ advanced practice    Clinician.       Faye Ramsay Karin Pinedo, DO  05/02/22 1755

## 2022-05-02 NOTE — Discharge Instructions (Addendum)
Please follow-up with primary care. Return to the ED for worsening symptoms or if there are any other concerns.

## 2023-06-25 NOTE — Progress Notes (Signed)
Subjective   Kylie [name] is a 22 y.o. G2P0010 at 22+5 weeks by 11 week [place] here today for an OB transfer of care from [facility]. Today reports +FM, no bleeding, leaking or cramping.     Note that pregnancy has been complicated by a few issues- firstly she was having recurrent headaches- taking reglan, IV fluids, fioricet. Has stopped all meds and feeling improved now.     Also notes that she has had difficulty with weight gain this pregnancy. 147 at confirm, lost a lot of weight- 123lbs at the lowest. Back to 132lbs today.    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 S/p 1 week course of valtrex and still having symptoms. Would like STI screen today, including HSV antibodies (reviewed not indicated).     She is COVID vaccinated.  She is flu vaccinated.    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 Anxiety    . Herpes        History reviewed. No pertinent surgical history.      Current Outpatient Medications:   .  doxylamine (Unisom, doxylamine,) 25 mg tablet, Take 0.5 tablets (12.5 mg total) by mouth every 8 (eight) hours as needed (vomiting/nausea) for up to 15 days., Disp: 23 tablet, Rfl: 0  .  lidocaine (XYLOCAINE) 5 % ointment, Apply topically as needed for mild pain (1-3). Use up to 4x daily to lesions, Disp: 30 g, Rfl: 0  .  prenatal no.52-iron-FA-dha 28 mg iron- 1 mg-200 mg cap, Take 1 tablet by mouth daily., Disp: 30 capsule, Rfl: 5  .  pyridoxine (VITAMIN B6) 50 mg tablet, Take 0.5 tablets (25 mg total) by  mouth every 8 (eight) hours as needed (nausea/vomiting) for up to 15 days., Disp: 22.5 tablet, Rfl: 0  .  valACYclovir (VALTREX) 500 mg tablet, Take 1 tablet (500 mg total) by mouth 2 (two) times a day., Disp: 180 tablet, Rfl: 1    No family history on file.   There is not any known family history of birth defects or developmental delays.    Review of Systems  A 10-point review of systems has been performed and found negative except for what was already stated in the HPI/current assessment.      Objective  Physical Exam  Exam conducted with a chaperone present.   Constitutional:       Appearance: Normal appearance.   HENT:      Head: Normocephalic and atraumatic.   Eyes:      Extraocular Movements: Extraocular movements intact.      Pupils: Pupils are equal, round, and reactive to light.   Pulmonary:      Effort: Pulmonary effort is normal. No respiratory distress.   Abdominal:      Palpations: Abdomen is soft.      Comments:  Fundal height 23cm   Genitourinary:     General: Normal vulva.      Labia:         Right: No tenderness or lesion.         Left: No tenderness or lesion.       Urethra: No prolapse.      Vagina: No vaginal discharge, tenderness or lesions.      Cervix: No cervical motion tenderness, friability or lesion.      Uterus: Normal. Not tender.       Adnexa:         Right: No tenderness or fullness.          Left: No tenderness or fullness.        Comments: Broad based ulceration noted in gluteal cleft, c/w HSV  Skin:     General: Skin is warm and dry.   Neurological:      General: No focal deficit present.      Mental Status: She is alert and oriented to person, place, and time.   Psychiatric:         Mood and Affect: Mood normal.         Behavior: Behavior normal.          Assessment/Plan:  Diagnoses and all orders for this visit:    HSV-2 infection  Routine screening for STI (sexually transmitted infection)  -Discussed recent primary HSV outbreak. Will do full STI screen including Nuuswab and  GC/CT.  -Discussed valtrex suppression for remainder of pregnancy given persistent symptoms and outbreak. PRN topical lidocaine given as well.  -Reviewed implications of HSV in pregnancy and also small but possible risk of neonatal infection.  -     Hepatitis B Surface Antigen (HBSAG) Screen, Qualitative With Confirmation  -     Hepatitis C Virus (HCV) Antibody Screen (High Risk) with Confirmation  -     HIV Screen with Reflex to Confirmation  -     Rapid Plasma Reagin (RPR), Qualitative Test with Reflex to Titer and Confirmation  -     Herpes Simplex Virus (HSV) Types 1/2 Specific Antibodies, IgG    Encounter for supervision of normal pregnancy in multigravida in second trimester  [redacted] weeks gestation of pregnancy  1.  Pregnancy issues including diet, exercise, work and travel discussed.  2.  Practice policies, scheduled prenatal visits and ultrasounds discussed.  3.  Delivery and emergency department visits at [facility] discussed.  4. Continue routine PNC, plan for 1hr GTT nv.  5. 3TM growth scan due to transfer in    Other orders  -     valACYclovir (VALTREX) 500 mg tablet; Take 1 tablet (500 mg total) by mouth 2 (two) times a day.  -     lidocaine (XYLOCAINE) 5 % ointment; Apply topically as needed for mild pain (1-3). Use up to 4x daily to lesions

## 2023-08-15 NOTE — Progress Notes (Signed)
 Repeat BP 128/93 - reviewed [facility] records and normotensive (some discussion of elevated BP at 15 weeks but all recorded BP in the EMR were normotensive). Pre-e labs today. Return  in 2 days for NST + PNV. May rule in for pre-e if remains mild range. Has BP cuff at home. Intermittent prolonged migraines, resolved today. Tdap today. +FM. Pre-e precautions.

## 2023-08-16 NOTE — Progress Notes (Signed)
Redraw done.

## 2023-08-17 NOTE — H&P (Signed)
 -------------------------------------------------------------------------------  Attestation signed by Joshua Chad Counihan, MD at 08/20/2023  6:57 AM  M.D./CNM Triage H&P Attestation  I have personally reviewed the H&P, documentation and triage course and agree with management of care.     -------------------------------------------------------------------------------      Obstetrical Admission History and Physical        Chief Complaint: Shortness of Breath (Patient c/o of difficulty breathing and SOB with headache, abdominal pain and cramping. Pt stated she was told by OB to come and be evaluated due to elevated BP and headache that is not relieved with tylenol . ) and Headache     History of Present Illness  Kylie [name] is a 22 y.o. female G2P0010 presenting at [redacted]w[redacted]d for OB clearance. Patient was seen today in the ED for complaint of SOB, HA, abdominal cramping. She had MRBP initially  while there follow by all normal pressures. Tylenol /Vistaril and IV fluids which helped her HA and abdominal cramping and all pre-e labs WNL. She was cleared down in the ED and subsequently sent upstair for OB clearance. She has no complaints upon arriving to [facility] triage. Feels well    Her pregnancy is complicated by:   TOC at 22 weeks from [facility] - will be moving to [place] to be closer to family  2. SCH in 1TM   3. Severe n/v in 1TM with 20lb weight loss (147lbs at [facility]irm)  4. Anxiety, self-discontinued Wellbutrin  5. 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 GHTN/Possibly pre-e -- normotensive at [facility] - now with mild range BP at 30 weeks (? Elevated BP per pt at 15 weeks -- review of records does not indicate elevations in the EMR)    Obstetrical History   # 1 - Date: 11/10/20, Sex: None, Weight: None, GA: None, Type: None, Apgar1: None, Apgar5: None, Living: None, Birth Comments: None    # 2 - Date: None, Sex: None, Weight: None, GA: None, Type: None, Apgar1: None, Apgar5: None, Living: None, Birth Comments: None      Problem  List:   Patient Active Problem List   Diagnosis   . HSV-2 infection   . Anxiety   . Depression, unspecified   . Depressive disorder   . Dysmenorrhea   . Herpes virus infection in mother during second trimester of pregnancy   . Hypercholesterolemia   . Nausea/vomiting in pregnancy   . PTSD (post-traumatic stress disorder)   . Subchorionic hemorrhage in first trimester   . Vaginal bleeding       Past Medical History  She has a past medical history of Anxiety, Depression, unspecified (02/16/2021), and Herpes.    Surgical History  She has no past surgical history on file.     Social History  She has no history on file for tobacco use, alcohol use, and drug use.    Family History  The patient's family history is not on file.    Allergies  Patient has no known allergies.    Medications  Medications Prior to Admission   Medication Sig Dispense Refill Last Dose   . ascorbic acid (Vitamin C) 250 mg tablet Take 1 tablet (250 mg total) by mouth every other day. Take with iron. 45 tablet 2    . ferrous gluconate 324 mg (38 mg iron) tab tablet Take 1 tablet (324 mg total) by mouth every other day. 45 tablet 2    . valACYclovir (VALTREX) 500 mg tablet Take 1 tablet (500 mg total) by mouth 2 (two) times a  day. 180 tablet 1        Vital Signs:  Blood pressure 121/84, pulse 76, temperature 98.2 F (36.8 C), temperature source Oral, resp. rate 17, SpO2 100%.  There is no height or weight on file to calculate BMI.  Patient Vitals for the past 24 hrs:   BP Temp Temp src Pulse Resp SpO2   08/17/23 0352 -- 98.8 F (37.1 C) Oral -- 16 --   08/17/23 0300 121/84 -- -- 76 17 100 %   08/17/23 0200 128/89 -- -- 69 16 100 %   08/17/23 0130 156/82 -- -- 66 15 98 %   08/17/23 0100 115/84 -- -- 92 (!) 21 100 %   08/17/23 0030 124/88 -- -- 84 18 100 %   08/17/23 0000 (!) 142/92 -- -- 87 -- 100 %   08/16/23 2354 (!) 141/99 -- -- 78 18 98 %   08/16/23 2341 (!) 138/97 98.2 F (36.8 C) Oral 93 18 100 %       ROS: Negative except that noted in HPI.      Physical exam:   Gen-  Alert and interactive, NAD.   CV-   Regular rate  PULM-   Respirations unlabored on room air  ABD-   Gravid, nontender  Ext-   No LE edema  Neuro-  2+ DTRs, no clonus     Cervical Exam:  deferred      FHR: 135 baseline, moderate variability, (+) accelerations, no decelerations. reactive  TOCO: no contractions       Prenatal Labs reviewed in its entirety   Prenatal Results       Initial Prenatal Labs       Test Value Reference Range Date Time    ABO/RH         Antibody Screen        Hemoglobin  10.2 g/dL* 89.1 - 84.4 91/86/75 9075       10.3 g/dL* 89.1 - 84.4 93/69/75 9865    Hematocrit  31 %* 34 - 46 07/24/23 0924       31 %* 34 - 46 06/10/23 0134    Platelets  208 10*3/uL 153 - 400 07/24/23 0924       154 10*3/uL 153 - 400 06/10/23 0134    Rubella qualitative         Rubella quantitative         Varicella Antibody, IGG        Hep B Surface Antigen   Non-Reactive  Non-Reactive 06/25/23 1211    Hep C Antibody  Non-Reactive  Non-Reactive 06/25/23 1211    HIV Ab/Ag P24  Non-Reactive  Non-Reactive 07/24/23 0924       Non-Reactive  Non-Reactive 06/25/23 1211    Syphilis Antibody  Non-Reactive  Non-Reactive 07/24/23 0924    RPR  Non-Reactive  Non-Reactive 06/25/23 1211    Gonorrhea Screen  Negative  Negative 06/25/23 1437    Chlamydia Screen  Negative  Negative 06/25/23 1437    Urine Culture  No uropathogens isolated.   06/10/23 0452       No Growth   06/10/23 0307              Baseline/Preeclampsia Labs       Test Value Reference Range Date Time    PC Ratio        24 Hour Urine Protein        Creatinine  0.98 mg/dL* 9.48 - 9.04 93/69/75 9865    AST  15 U/L 13 - 39 06/10/23 0134    ALT  5 U/L* 7 - 52 06/10/23 0134              Genetic Screening       Test Value Reference Range Date Time    Hemoglobin Electrophoresis        Carrier Screening        Natera Carrier Screening        NIPT        Natera NIPT        Mat 21 NIPT Trisomy 21        Mat 21 NIPT Trisomy 18        Mat 21 NIPT Trisomy 13         AFP        Quad Screen                  Diabetes Screening       Test Value Reference Range Date Time    POC GTT (50g)  102 md/dL 40 - 870 91/86/75 8977    POC Fasting (100g)        POC GTT 1 Hour (100g)        POC GTT 2 Hour (100g)        POC GTT 3 Hour (100g)        GTT (50g)        GTT Fasting (100g)        GTT 1 Hour (100g)        GTT 2 Hour (100g)        GTT 3 Hour (100g)                  28 Week Labs       Test Value Reference Range Date Time    POC Hemoglobin        Hemoglobin  9.9 g/dL* 89.1 - 84.4 90/93/75 9987       9.6 g/dL* 89.1 - 84.4 90/94/75 8790       10.2 g/dL* 89.1 - 84.4 91/86/75 9075    Hematocrit  31 %* 34 - 46 08/17/23 0012       29 %* 34 - 46 08/16/23 1209       31 %* 34 - 46 07/24/23 0924    Platelets  220 10*3/uL 153 - 400 08/17/23 0012       208 10*3/uL 153 - 400 08/16/23 1209       208 10*3/uL 153 - 400 07/24/23 0924    HIV Ab/Ag P24  Non-Reactive  Non-Reactive 07/24/23 0924    Syphilis Antibody  Non-Reactive  Non-Reactive 07/24/23 0924    RPR        Antibody Screen                  36 Week Labs       Test Value Reference Range Date Time    GBS        Gonorrhea Screen        Chlamydia Screen        Chlamydia Screening (Urine)        Gonorrhea Screening (Urine)                  Legend    ^: Historical                           No results found for: ABORH, HEPBSAG,  HIVAB    Labs  Labs Reviewed   COMPREHENSIVE METABOLIC PANEL - Abnormal       Result Value    Sodium 138      Potassium 3.9      Chloride 107      CO2 21      Anion Gap 10      Glucose, Random 82      Blood Urea Nitrogen (BUN) 5 (*)     Creatinine 0.48 (*)     eGFR >90      Albumin 3.5 (*)     Total Protein 6.5      Bilirubin, Total 0.3      Alkaline Phosphatase (ALP) 112      Aspartate Aminotransferase (AST) 13      Alanine Aminotransferase (ALT) 6 (*)     Calcium 8.4 (*)     BUN/Creatinine Ratio 10.4      Corrected Calcium 8.8     TROPONIN, HIGH SENSITIVE X 2 (0 HOUR BASELINE + 2 HOUR) - Abnormal    Troponin, High  Sensitive 16 (*)     Troponin, High Sensitive Interpretation Abnormal (*)     Narrative:     The testing method is an immunoassay manufactured by [employer] performed on [employer].  Values obtained with different assay methods may be different and cannot be used interchangeably.  High Sensitive Troponin specificity:  Troponin I   PROTEIN, TOTAL, RANDOM URINE - Abnormal    Protein, Total, Urine 8      Creatinine, Urine 45      Protein/Creatinine Ratio, Urine 178 (*)    CBC WITH DIFFERENTIAL - Abnormal    WBC 9.87      Monocyte Distribution Width 16.6      RBC 3.67 (*)     Hemoglobin 9.9 (*)     Hematocrit 31 (*)     Mean Corpuscular Volume (MCV) 84      Mean Corpuscular Hemoglobin (MCH) 27      Mean Corpuscular Hemoglobin Conc (MCHC) 32      Red Cell Distribution Width (RDW) 14.4      Platelet Count (PLT) 220      Mean Platelet Volume (MPV) 10.0      Neutrophils % 70      Lymphocytes % 18      Monocytes % 8      Eosinophils % 4      Basophils % 0      nRBC % 0      Neutrophils Absolute 6.90      Lymphocytes # 1.80      Monocytes # 0.80      Eosinophils # 0.40 (*)     Basophils # 0.00      nRBC Absolute 0.01     URINALYSIS WITH REFLEX TO MICROSCOPIC - Normal    Color, Urine Colorless      Clarity, Urine Clear      Specific Gravity, Urine 1.007      pH, Urine 7.0      Protein, Urine Negative      Glucose, Urine Negative      Ketones, Urine Negative      Bilirubin, Urine Negative      Blood, Urine Negative      Nitrite, Urine Negative      Leukocyte Esterase, Urine Negative      Urobilinogen, Urine Normal     TROPONIN, HIGH SENSITIVE, 2HR RFX - Normal    Troponin,  High Sensitive <6      Troponin, High Sensitive Interpretation Normal      Narrative:     The testing method is an immunoassay manufactured by [employer] performed on [employer].  Values obtained with different assay methods may be different and cannot be used interchangeably.  High Sensitive Troponin specificity:  Troponin  I   B-TYPE NATRIURETIC PEPTIDE (BNP) - Normal    B-Type Natriuretic Peptide (BNP) 24      Narrative:     The testing method is an immunoassay manufactured by [employer] performed on [employer].  Values obtained with different assay methods may be different and cannot be used interchangeably.   POC URINALYSIS AUTO WITHOUT MICROSCOPIC (AH)   POC URINALYSIS WITHOUT MICROSCOPIC (MANUAL READ) (AH)   POC GLUCOSE REQUEST         Assessment/Plan:  Franklin is a 22 y.o. G2P0010 at [redacted]w[redacted]d by who presents to Triage for OB clearance. Patient pre-e labs WNL. Pregnancy HA that resolved today with tylenol /vistaril and IV hydration. Patient ruled in for [facility]. Reactive trcing while here.    Dr. Austin aware and clears patient for D/C    Active Problems:    Elevated blood pressure affecting pregnancy in third trimester, antepartum    Gestational hypertension without significant proteinuria in third trimester    [redacted] weeks gestation of pregnancy    Pregnancy headache in third trimester    - Discharged to home in stable condition.   - Follow up today as scheduled,   - reviewed pre-e s.s  - Discussed warning signs prompting return to triage, including but not limited to increased leakage or fluid, contractions more than  6 per  1 hour, decreased fetal movement, persistent low back pain or cramping or bleeding from vaginal area.   - NST reactive       Shona Pines, CNM  08/17/2023 3:56 AM            *Some images could not be shown.

## 2023-08-17 NOTE — Progress Notes (Signed)
 Subjective:  22 y.o. G2P0010 at [redacted]w[redacted]d presents for routine prenatal visit. denies regular uterine contractions, vaginal bleeding, and leaking of fluid. Describes fetal movement as present. denies headaches since treatment in triage, denies changes in vision.     Ruled in for [facility]. Also reports intermittent shortness of breath and palpitations. ED visit last night. Hx of childhood asthma needing nebulizer treatments.      Objective:  BP 125/87   Wt 62.1 kg (136 lb 12.8 oz)   BMI 24.23 kg/m      NST reactive by all criteria, personally reviewed by me.   Baseline: 150, moderate variability, +accelerations, no decelerations noted  Monitored for at least 20 consecutive minutes.    Assessment & Plan:  IUP at [redacted]w[redacted]d  Indication for antenatal testing: The primary encounter diagnosis was Gestational hypertension, third trimester. 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  Reviewed prenatal record, updated as necessary  Education & anticipatory guidance: pre-e precautions, continue antenatal testing 2x weekly    Naveya was seen today for routine prenatal visit and nst/bpp visit.    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

## 2023-08-19 ENCOUNTER — Inpatient Hospital Stay
Admit: 2023-08-19 | Discharge: 2023-08-19 | Disposition: A | Discharge status: Home / Self Care | Payer: PRIVATE HEALTH INSURANCE | Attending: Specialist

## 2023-08-19 DIAGNOSIS — O133 Gestational [pregnancy-induced] hypertension without significant proteinuria, third trimester: Secondary | ICD-10-CM

## 2023-08-19 LAB — COMPREHENSIVE METABOLIC PANEL
ALT: <7 U/L — ABNORMAL LOW (ref 10–49)
AST: 16 U/L (ref 0.0–33.9)
Albumin: 2.9 g/dL — ABNORMAL LOW (ref 3.4–5.0)
Alkaline Phosphatase: 133 U/L — ABNORMAL HIGH (ref 46–116)
Anion Gap: 6 mmol/L (ref 5–15)
BUN: 5 mg/dL — ABNORMAL LOW (ref 9–23)
CO2: 24 meq/L (ref 20–31)
Calcium: 9 mg/dL (ref 8.7–10.4)
Chloride: 106 meq/L (ref 98–107)
Creatinine: 0.66 mg/dL (ref 0.55–1.02)
GFR African American: >60
GFR Non-African American: >60
Glucose: 100 mg/dL (ref 74–106)
Potassium: 3.4 meq/L — ABNORMAL LOW (ref 3.5–5.1)
Sodium: 136 meq/L (ref 136–145)
Total Bilirubin: 0.4 mg/dL (ref 0.30–1.20)
Total Protein: 6.4 g/dL (ref 5.7–8.2)

## 2023-08-19 LAB — URINALYSIS
Bilirubin, Urine: NEGATIVE
Blood, Urine: NEGATIVE
Glucose, Ur: NEGATIVE mg/dL
Ketones, Urine: NEGATIVE mg/dL
Leukocyte Esterase, Urine: NEGATIVE
Nitrite, Urine: NEGATIVE
Protein, Urine: NEGATIVE mg/dL
Specific Gravity, UA: 1.01 (ref 1.005–1.030)
Urobilinogen, Urine: 0.2 mg/dL (ref 0.0–1.0)
pH, Urine: 7 (ref 5.0–9.0)

## 2023-08-19 LAB — PROTEIN / CREATININE RATIO, URINE
Creatinine, Random Urine: 85.5 mg/dL (ref 30.0–125.0)
Protein, Urine, Random: 7.3 mg/dL (ref 1.0–14.0)
Urine Total Protein Creatinine Ratio: 0.09 g/(24.h) (ref 0.00–0.19)

## 2023-08-19 LAB — LACTATE DEHYDROGENASE: LD: 229 U/L (ref 120–246)

## 2023-08-19 LAB — CBC
Hematocrit: 30.4 % — ABNORMAL LOW (ref 35.0–47.0)
Hemoglobin: 9.7 g/dL — ABNORMAL LOW (ref 11.0–16.0)
MCH: 27.1 pg (ref 25.4–34.6)
MCHC: 31.9 g/dL (ref 30.0–36.0)
MCV: 84.9 fL (ref 80.0–98.0)
MPV: 11.6 fL — ABNORMAL HIGH (ref 6.0–10.0)
Platelets: 216 10*3/uL (ref 140–450)
RBC: 3.58 M/uL — ABNORMAL LOW (ref 3.60–5.20)
RDW: 43 (ref 36.4–46.3)
WBC: 10.6 10*3/uL (ref 4.0–11.0)

## 2023-08-19 LAB — ABO/RH: ABO/Rh: O POS

## 2023-08-19 LAB — ANTIBODY SCREEN: Antibody Screen: NEGATIVE

## 2023-08-19 MED ORDER — BUTALBITAL-APAP-CAFFEINE 50-325-40 MG PO TABS
50-325-40 | ORAL | Status: DC | PRN
Start: 2023-08-19 — End: 2023-08-19

## 2023-08-19 MED ORDER — METOCLOPRAMIDE HCL 5 MG/ML IJ SOLN
5 | Freq: Four times a day (QID) | INTRAMUSCULAR | Status: DC
Start: 2023-08-19 — End: 2023-08-19

## 2023-08-19 MED ORDER — LACTATED RINGERS IV BOLUS
Freq: Once | INTRAVENOUS | Status: AC
Start: 2023-08-19 — End: 2023-08-19

## 2023-08-19 MED ADMIN — metoclopramide (REGLAN) injection 10 mg: 10 mg | INTRAVENOUS | @ 17:00:00 | NDC 00409341418

## 2023-08-19 MED ADMIN — butalbital-acetaminophen-caffeine (FIORICET, ESGIC) per tablet 1 tablet: 1 | ORAL | @ 17:00:00 | NDC 60687067211

## 2023-08-19 MED ADMIN — lactated ringers bolus 500 mL: 500 mL | INTRAVENOUS | @ 17:00:00 | NDC 00338011704

## 2023-08-19 MED FILL — METOCLOPRAMIDE HCL 5 MG/ML IJ SOLN: 5 MG/ML | INTRAMUSCULAR | Qty: 2

## 2023-08-19 MED FILL — BUTALBITAL-APAP-CAFFEINE 50-325-40 MG PO TABS: 50-325-40 MG | ORAL | Qty: 1

## 2023-08-19 MED FILL — LACTATED RINGERS IV SOLN: INTRAVENOUS | Qty: 500

## 2023-08-19 NOTE — ED Provider Notes (Signed)
 [facility]  Obstetrics Emergency Department Treatment Note        Patient: Kylie [name] Age: 22 y.o. Sex: female    Date of Birth: [birth year] Admit Date: 08/19/2023 PCP: Unknown, Provider   MRN: [id number]  CSN: [id number]     Room: 3115/3115 Time Dictated: 2:06 PM        Chief Complaint   Chief Complaint   Patient presents with    Headache        History of Present Illness   Kylie [name] is a 22 y.o. G3P0020 with an estimated gestational age of [redacted]w[redacted]d with Estimated Date of Delivery: 10/22/23. She presents to OBED complaining of headache and blurred vision. Patient states having a headache that started at 3 am. Patient states having some blurred vision with dizzyness that started also at 3 am but comes and goes. Patient denies taking any tylenol , she states that she has been having headaches since she was about 14-[redacted]wks pregnant and that tylenol  does not help. Patient also states that she has been having blurred vision on/off and is always when she gets dizzy when her headaches started. Patient has been receiving care in [place], [place] and during her last visit 08/17/23 she was diagnosed with gestational hypertension and was told that she might need to have an induction at 34wks. Patient states that she was trying to deliver here because her mom and other relatives are here, but since August she has been trying to find an OB/GYN practice that would take care without success. Patient states that she thinks that she will be going back to [place] and considering just delivering there. Patient has next appt for 08/23/23.   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  Negative for fever.   Eyes:  Positive for visual disturbance.   Respiratory: Negative.     Genitourinary:  Negative for pelvic pain, vaginal bleeding and vaginal discharge.   Neurological:  Positive for headaches.        Past Medical/Surgical History     Past Medical History:   Diagnosis Date    Concussion 2015    Concussion 2016    Concussion 2016    HSV (herpes simplex virus) infection      Past Surgical History:   Procedure Laterality Date    DILATION AND CURETTAGE OF UTERUS      WISDOM TOOTH EXTRACTION     Hx of concussion x 3    Social History     Social History     Socioeconomic History    Marital status: Single  Spouse name: Not on file    Number of children: Not on file    Years of education: Not on file    Highest education level: Not on file   Occupational History    Not on file   Tobacco Use    Smoking status: Not on file    Smokeless tobacco: Not on file   Substance and Sexual Activity    Alcohol use: Not on file    Drug use: Not on file    Sexual activity: Not on file   Other Topics Concern    Not on file   Social History Narrative    Not on file     Social Determinants of Health     Financial Resource Strain: Not on file   Food Insecurity: Low Risk  (08/17/2023)    Received from [facility]    Hunger Vital Sign     Worried About Running Out of Food in the Last Year: Never true     Ran Out of Food in the Last Year: Never true   Transportation Needs: No Transportation Needs (08/17/2023)    Received from [employer]     In the past 12 months, has lack of reliable transportation kept you from medical appointments, meetings, work or from getting things needed for daily living? : No   Physical Activity: Not on file   Stress: Not on file   Social Connections: Unknown (04/25/2022)    Received from [facility]    Social Network     Social Network: Not on file   Intimate Partner  Violence: Not At Risk (06/06/2023)    Received from [facility]    Humiliation, Afraid, Rape, and Kick questionnaire     Fear of Current or Ex-Partner: No     Emotionally Abused: No     Physically Abused: No     Sexually Abused: No   Housing Stability: Low Risk  (08/17/2023)    Received from [employer] Stability Vital Sign     What is your living situation today?: I have a steady place to live     Think about the place you live. Do you have problems with any of the following? Choose all that apply:: None/None on this list       Family History   No family history on file.    Current Medications     Current Facility-Administered Medications   Medication Dose Route Frequency Provider Last Rate Last Admin    lactated ringers  bolus 500 mL  500 mL IntraVENous Once Rosado-Torres, Jullia Mulligan V, MD 247.9 mL/hr at 08/19/23 1239 500 mL at 08/19/23 1239    metoclopramide  (REGLAN ) injection 10 mg  10 mg IntraVENous Q6H Rosado-Torres, Edee Nifong V, MD   10 mg at 08/19/23 1252    butalbital -acetaminophen -caffeine  (FIORICET , ESGIC ) per tablet 1 tablet  1 tablet Oral Q4H PRN Rosado-Torres, Marolyn Urschel V, MD   1 tablet at 08/19/23 1252     Current Outpatient Medications   Medication Sig Dispense Refill    valACYclovir (VALTREX) 500 MG tablet Take 1 tablet by mouth daily          Allergies   No Known Allergies    Physical Exam   Patient Vitals for the past 24 hrs:   BP Pulse Resp SpO2 Height Weight   08/19/23 1400 121/63 85 -- 99 % -- --   08/19/23 1345 118/61 80 -- 99 % -- --  08/19/23 1330 115/66 89 -- 100 % -- --   08/19/23 1315 (!) 130/90 (!) 112 -- 99 % -- --   08/19/23 1304 132/81 92 -- -- -- --   08/19/23 1246 135/89 (!) 108 -- -- -- --   08/19/23 1227 137/87 (!) 107 -- -- -- --   08/19/23 1212 -- -- -- -- 1.524 m (5') 59 kg (130 lb)   08/19/23 1205 (!) 140/86 (!) 109 16 100 % -- --      ED Triage Vitals   Encounter Vitals Group      BP 08/19/23 1205 (!) 140/86      Systolic BP Percentile --       Diastolic BP Percentile --        Pulse 08/19/23 1205 (!) 109      Respirations 08/19/23 1205 16      Temp --       Temp src --       SpO2 08/19/23 1205 100 %      Weight - Scale 08/19/23 1212 59 kg (130 lb)      Height 08/19/23 1212 1.524 m (5')      Head Circumference --       Peak Flow --       Pain Score --       Pain Loc --       Pain Education --       Exclude from Growth Chart --         Physical Exam  Cardiovascular:      Rate and Rhythm: Normal rate.      Pulses: Normal pulses.   Abdominal:      General: Abdomen is flat.      Tenderness: There is no abdominal tenderness.   Musculoskeletal:      Right lower leg: No edema.      Left lower leg: No edema.   Neurological:      General: No focal deficit present.      Mental Status: She is alert and oriented to person, place, and time.           OB Exam     Cervical Exam: deferred  Uterine Activity: none  Fetal Heart Rate: 150, category 1 and reactive/reassuring, moderate variability, accelerations present, no decelerations  I personally reviewed and interpreted the fetal non-stress test.         Diagnostic Studies   Lab:   Recent Results (from the past 12 hour(s))   Urinalysis    Collection Time: 08/19/23 12:31 PM   Result Value Ref Range    Color, UA Yellow Yellow,Straw      Clarity, UA Clear Clear      Glucose, Ur Negative Negative mg/dl    Bilirubin, Urine Negative Negative      Ketones, Urine Negative Negative mg/dl    Specific Gravity, UA 1.010 1.005 - 1.030      Blood, Urine Negative Negative      pH, Urine 7.0 5.0 - 9.0      Protein, Urine Negative Negative mg/dl    Urobilinogen, Urine 0.2 E.U./dL 0.0 - 1.0 mg/dl    Nitrite, Urine Negative Negative      Leukocyte Esterase, Urine Negative Negative     CBC    Collection Time: 08/19/23 12:31 PM   Result Value Ref Range    WBC 10.6 4.0 - 11.0 1000/mm3    RBC 3.58 (L) 3.60 - 5.20 M/uL    Hemoglobin 9.7 (L)  11.0 - 16.0 gm/dl    Hematocrit 69.5 (L) 35.0 - 47.0 %    MCV 84.9 80.0 - 98.0 fL    MCH 27.1 25.4 - 34.6 pg    MCHC 31.9 30.0 - 36.0  gm/dl    Platelets 783 859 - 450 1000/mm3    MPV 11.6 (H) 6.0 - 10.0 fL    RDW 43.0 36.4 - 46.3     Comprehensive Metabolic Panel    Collection Time: 08/19/23 12:31 PM   Result Value Ref Range    Potassium 3.4 (L) 3.5 - 5.1 mEq/L    Chloride 106 98 - 107 mEq/L    Sodium 136 136 - 145 mEq/L    CO2 24 20 - 31 mEq/L    Glucose 100 74 - 106 mg/dl    BUN 5 (L) 9 - 23 mg/dl    Creatinine 9.33 9.44 - 1.02 mg/dl    GFR African American >60.0      GFR Non-African American >60      Calcium 9.0 8.7 - 10.4 mg/dl    Anion Gap 6 5 - 15 mmol/L    AST 16.0 0.0 - 33.9 U/L    ALT <7 (L) 10 - 49 U/L    Alkaline Phosphatase 133 (H) 46 - 116 U/L    Total Bilirubin 0.40 0.30 - 1.20 mg/dl    Total Protein 6.4 5.7 - 8.2 gm/dl    Albumin 2.9 (L) 3.4 - 5.0 gm/dl   Lactate Dehydrogenase    Collection Time: 08/19/23 12:31 PM   Result Value Ref Range    LD 229 120 - 246 U/L   Protein / creatinine ratio, urine    Collection Time: 08/19/23 12:31 PM   Result Value Ref Range    Creatinine, Random Urine 85.5 30.0 - 125.0 mg/dl    Protein, Urine, Random 7.3 1.0 - 14.0 mg/dl    Urine Total Protein Creatinine Ratio 0.09 0.00 - 0.19 gm/24 Hr   ABO/RH    Collection Time: 08/19/23 12:31 PM   Result Value Ref Range    ABO/Rh O Rh Positive     ANTIBODY SCREEN    Collection Time: 08/19/23 12:31 PM   Result Value Ref Range    Antibody Screen NEG       Labs Reviewed   CBC - Abnormal; Notable for the following components:       Result Value    RBC 3.58 (*)     Hemoglobin 9.7 (*)     Hematocrit 30.4 (*)     MPV 11.6 (*)     All other components within normal limits   COMPREHENSIVE METABOLIC PANEL - Abnormal; Notable for the following components:    Potassium 3.4 (*)     BUN 5 (*)     ALT <7 (*)     Alkaline Phosphatase 133 (*)     Albumin 2.9 (*)     All other components within normal limits   URINALYSIS   LACTATE DEHYDROGENASE   PROTEIN / CREATININE RATIO, URINE   ABO/RH    Narrative:     Specimen is valid for 3 days - nurse to verify valid specimen   ANTIBODY  SCREEN    Narrative:     Specimen is valid for 3 days - nurse to verify valid specimen   TYPE AND SCREEN         Imaging:    No results found.     ED Course     BP 121/63  Pulse 85   Resp 16   Ht 1.524 m (5')   Wt 59 kg (130 lb)   SpO2 99%   BMI 25.39 kg/m            Medications   lactated ringers  bolus 500 mL (500 mLs IntraVENous New Bag 08/19/23 1239)   metoclopramide  (REGLAN ) injection 10 mg (10 mg IntraVENous Given 08/19/23 1252)   butalbital -acetaminophen -caffeine  (FIORICET , ESGIC ) per tablet 1 tablet (1 tablet Oral Given 08/19/23 1252)       Medical Decision Making   Headache - serial BP, PIH panel, resolved with fioricet  and reglan   Gestational hypertension    Final Diagnosis / MDM     IUP [redacted]w[redacted]d  Gestational hypertension  Headache     Disposition   Discharge home  Follow up with her OB  Work excuse given        Soundra LULLA Parlor, MD  [facility]  August 19, 2023  2:06 PM      My signature above authenticates this document and my orders, the final    diagnosis(es), discharge prescription(s), and instructions in the Epic    record.  If you have any questions please contact [phone].  Nursing notes have been reviewed by myself.

## 2023-08-19 NOTE — Progress Notes (Addendum)
 1203: G3P0020 [redacted]w[redacted]d presents to [facility] OBED c/o H/A, blurred vision, dizziness since 3 AM this morning. Pt states that she was dx b primary OB with [facility] on Friday. Admits to symptoms since pregnancy was 14-15 weeks. States she is seen by OB in [place] but has been attempting to get seen by OB here since August but has not been able to get into an apt. Admits to hx of concussion x 3- states years were 2015 and in 2016 x 2. Denies ctx, vb, spotting, LOF. Endorses + FM.     1204: Dr Mayo at bedside.     1306: Pt c/o not feeling right, my heart is racing, and I just don't feel right. VS taken. Pt repositioned left lateral tilt. Made Dr Mayo aware    1415: Per Dr Mayo ok for patient to be discharged. IV removed. D/C teaching provided. Pt v/u. Pt ambulated off of unit in NAD, remains undelivered at this time.

## 2023-08-22 NOTE — Telephone Encounter (Signed)
 Copied from CRM #[id number]. Topic: Clinical Concerns - Medical Question  >> Aug 22, 2023  5:20 PM Martie wrote:  Kylie [name], Kylie [name] called with symptoms or general medical question. Message sent to Triage Pool. Informed the patient that someone will call them back and this call may come from a blocked number. [facility] -  [name]  >> Aug 22, 2023  5:20 PM Martie wrote:  Kylie [name], Kylie [name] is calling for clinical concerns (Ask: What symptoms are you calling about today, AND how long have you had these symptoms? Must Review HPKW list for symptoms) Document Name of Triage Nurse/BH Rep taking the call when applicable)      Include all details related to the request(s) below:  OB     Patient reports blood pressure went up to 150/91.     Confirm and type the Best Contact Number below:    Patient/caller contact number:    [phone]            [] Home  [x] Mobile  [] Work  []  Other   []  Okay to leave a voicemail      Medication List:    Current Outpatient Medications:   .  acetaminophen  (TYLENOL ) 500 mg tablet, Take 1 tablet (500 mg total) by mouth every 4 (four) hours as needed for headaches (take with reglan  and benadryl )., Disp: 30 tablet, Rfl: 0  .  ascorbic acid (Vitamin C) 250 mg tablet, Take 1 tablet (250 mg total) by mouth every other day. Take with iron., Disp: 45 tablet, Rfl: 2  .  diphenhydrAMINE  (BENADRYL ) 25 mg capsule, Take 1 capsule (25 mg total) by mouth every 4 (four) hours as needed for itching (headache - take with reglan /tylenol )., Disp: 30 capsule, Rfl: 0  .  ferrous gluconate 324 mg (38 mg iron) tab tablet, Take 1 tablet (324 mg total) by mouth every other day., Disp: 45 tablet, Rfl: 2  .  metoclopramide  (REGLAN ) 10 mg tablet, Take 1 tablet (10 mg total) by mouth 4 (four) times a day as needed (headache - take with tylenol  and benadryl )., Disp: 30 tablet, Rfl: 1  .  valACYclovir (VALTREX) 500 mg tablet, Take 1 tablet (500 mg total) by mouth 2 (two) times a day., Disp: 180 tablet,  Rfl: 1          Medication Request/Refills: Pharmacy Information (if applicable)     []  Not Applicable         []  Pharmacy listed  Send Medication Request to:                                                   []  Pharmacy not listed (added to pharmacy list in Epic) Send Medication Request to:         Listed Pharmacies:  [id number] - [place], [place] - [address] - PHONE: [phone] - FAX: [phone]  [id number] - [place], [place] - [address] - PHONE: [phone] - FAX: [phone]

## 2023-08-24 ENCOUNTER — Emergency Department: Admit: 2023-08-25 | Payer: PRIVATE HEALTH INSURANCE | Primary: Diagnostic Radiology

## 2023-08-24 ENCOUNTER — Inpatient Hospital Stay: Admit: 2023-08-24 | Discharge: 2023-08-24 | Payer: PRIVATE HEALTH INSURANCE

## 2023-08-24 ENCOUNTER — Inpatient Hospital Stay
Admit: 2023-08-24 | Discharge: 2023-08-25 | Disposition: A | Discharge status: Home / Self Care | Payer: PRIVATE HEALTH INSURANCE | Attending: Emergency Medicine

## 2023-08-24 DIAGNOSIS — O113 Pre-existing hypertension with pre-eclampsia, third trimester: Secondary | ICD-10-CM

## 2023-08-24 NOTE — Telephone Encounter (Signed)
 G2P0 31 wks. GHTN.  H/o asthma. Pt now living in [place] and has an appt 9/24 with new Ob. . Ended up going to ED last week because BP was elevated 150/90's. and was told to try bedrest and f/u with new OB next week. Was not given any HTN medications. She can't come back to [place] so wants to know what she should do about being seen for appt next week?  Advised to call OB/GYN she has an appt with to see if can get worked in sooner. Pt mentions she feels a little SOB and feels like it is her asthma. Would like to get Rx for Alb inhaler. Can you please send Rx?

## 2023-08-24 NOTE — Discharge Instructions (Addendum)
 Home Undelivered Discharge Instructions    After Discharge Orders:    Dr. Mayo HIGHLY recommends going back home and being seen by your primary OB until your appointment with Dr. Elnor on 09/04/23.   If unable to go back to [facility] by Monday 08/30/23, please come back to [facility] for a NST on Monday.      I understand that if any problems occur once I am at home I am to contact my physician.    I understand and acknowledge receipt of the instructions indicated above.       _____________________________________________                                                        Physician's or R.N.'s Signature                Date/Time                          _____________________________________________                                                        Patient or Representative Signature         Date/Time                 High Blood Pressure in Pregnancy: Care Instructions  Overview     High blood pressure (hypertension) means that the force of blood against your artery walls is too strong.  High blood pressure problems during pregnancy include:  Chronic hypertension. This is high blood pressure that starts before pregnancy.  Gestational hypertension. This is high blood pressure that starts in the second or third trimester of pregnancy.  Preeclampsia. This is a problem that includes high blood pressure and signs of organ injury during pregnancy. In some cases, it leads to eclampsia. Eclampsia causes seizures.  High blood pressure during pregnancy can affect the amount of oxygen and nutrients your baby receives. This can affect how your baby grows. High blood pressure can also cause other serious problems for both you and your baby. For example, the placenta might separate too early from the wall of the uterus (placental abruption). This can cause serious bleeding or premature birth.  To prevent problems, you and your baby will be watched very closely. You will have to check your blood pressure often during and after  the pregnancy.  If your blood pressure rises suddenly or is very high during pregnancy, your doctor may prescribe medicines. They can usually control blood pressure.  If your blood pressure affects your or your baby's health, you may need to be monitored in the hospital. You may get medicines. Or your doctor may need to deliver your baby early.  After your baby is born, your blood pressure will probably improve. But sometimes blood pressure problems continue after birth. If you had high blood pressure during pregnancy, you have more risk of having high blood pressure, heart disease, stroke, kidney disease, and diabetes later in life. Work with your doctor to make heart-healthy lifestyle choices. These include eating healthy foods, being active, staying at a healthy weight, and  not smoking. Get the checkups you need. Your doctor may also want you to check your blood pressure at home.  Follow-up care is a key part of your treatment and safety. Be sure to make and go to all appointments, and call your doctor if you are having problems. It's also a good idea to know your test results and keep a list of the medicines you take.  How can you care for yourself at home?  Take and write down your blood pressure at home if your doctor says to.  Take your medicines exactly as prescribed. Call your doctor if you think you are having a problem with your medicine.  Monitor yourself for symptoms of preeclampsia. Call your doctor if you have symptoms such as a severe headache, vision changes, or sudden swelling in your face and hands.  If you smoke, quit or cut back as much as you can. Smoking and vaping can be harmful to your baby. Talk to your doctor if you need help quitting.  Talk with your doctor about how much weight gain is healthy for you. Gaining too much weight while you're pregnant may be harmful.  Eat a variety of healthy foods. Include plenty of foods high in calcium, such as dairy products, almonds, and dark leafy  greens.  If your doctor says it's okay, get regular exercise. Doing things like walking or swimming several times a week can be healthy for you and your baby.  Try to reduce stress and find time to relax. This is very important if you continue to work or have a busy schedule. It's also important if you have small children at home.  If your doctor recommends it, keep track of your baby's movement. A common method is to note the length of time it takes to count 10 movements (such as kicks, flutters, or rolls). Call your doctor if you don't feel at least 10 movements in a 2-hour period. Track your baby's movements once each day. Bring this record with you to each prenatal visit.  When should you call for help?  Share this information with your partner or a friend. They can help you watch for warning signs.  Call 911  anytime you think you may need emergency care. For example, call if:    You passed out (lost consciousness).     You have a seizure.     You have trouble breathing.     You have chest pain.   Call your doctor now or seek immediate medical care if:    You have symptoms of preeclampsia, such as:  Sudden swelling of your face, hands, or feet.  New vision problems (such as dimness, blurring, or seeing spots).  A severe headache.     Your blood pressure is very high, such as 160/110 or higher.     Your blood pressure is higher than your doctor told you it should be, or it rises quickly.     You have any vaginal bleeding.     You have new nausea or vomiting.     You think that you are in labor.     You have pain in your belly or pelvis.     You gain weight rapidly.   Where can you learn more?  Go to [employer] and enter A052 to learn more about High Blood Pressure in Pregnancy: Care Instructions.  Current as of: June 19, 2022  Content Version: 14.1   2006-2024 [employer].   Care instructions adapted  under license by [facility]. If you have questions about a medical  condition or this instruction, always ask your healthcare professional. [employer] disclaims any warranty or liability for your use of this information.         Weeks 30 to 32 of Your Pregnancy: Care Instructions  Your baby is growing more every day. Its eyes can open and close, and it may have hair on its head. Your baby may sleep 20 to 45 minutes at a time and is more active at certain times.    You should feel your baby move several times every day. Your baby now turns less and kicks more.   This is a good time to tour your hospital or birthing center. You may also want to find childcare if needed.         To ease heartburn   Avoid foods that make your symptoms worse, such as chocolate, spicy foods, and caffeine .  Avoid bending over or lying down after meals.  Do not eat for 2 hours before bedtime.  Take antacids like Tums, but don't take ones that have sodium bicarbonate, magnesium trisilicate, or aspirin.        To care for large, swollen veins (varicose veins)   Try to avoid standing for long periods of time.  Sit with your feet propped up.  Wear support hose.  Get some exercise every day, like walking or swimming.  Counting your baby's kicks  Your doctor may ask you to count your baby's movements, such as kicks, flutters, or rolls.    Find a quiet place, and get comfortable. Write down your start time. Count your baby's movements (except hiccups). When your baby has moved 10 times, you can stop counting. Write down how many minutes it took.   If an hour goes by and you don't feel 10 movements, have something to eat or drink. Count for another hour. If you don't feel at least 10 movements in the 2-hour period, call your doctor.   Follow-up care is a key part of your treatment and safety. Be sure to make and go to all appointments, and call your doctor if you are having problems. It's also a good idea to know your test results and keep a list of the medicines you take.  Where can you learn  more?  Go to [employer] and enter X471 to learn more about Weeks 30 to 32 of Your Pregnancy: Care Instructions.  Current as of: June 19, 2022  Content Version: 14.1   2006-2024 [employer].   Care instructions adapted under license by [facility]. If you have questions about a medical condition or this instruction, always ask your healthcare professional. [employer] disclaims any warranty or liability for your use of this information.                Diet:  normal diet as tolerated    Rest: normal activity as tolerated    Other instructions: Do kick counts once a day on your baby. Choose the time of day your baby is most active. Get in a comfortable lying or sitting position and time how long it takes to feel 10 kicks, twists, turns, swishes, or rolls. Call your physician or midwife if there have not been 10 kicks in 2 hours    Call physician or midwife, return to Labor and Delivery, call 911, or go to the nearest Emergency Room if: increased leakage or fluid, decreased fetal movement, bleeding from vaginal area,  difficulty breathing, persistent headache, or vision change

## 2023-08-24 NOTE — ED Provider Notes (Signed)
 EMERGENCY DEPARTMENT HISTORY AND PHYSICAL EXAM      Date: 08/24/2023  Patient Name: Kylie [name]    History of Presenting Illness     Chief Complaint   Patient presents with    Hypertension    Shortness of Breath       History Provided By: History provided by: Patient    Chief Complaint: Headaches, shortness of breath, high blood pressure, pregnant    Additional History (Context): Kylie [name] is a 22 y.o. female who presents with [redacted] weeks pregnant and has been having elevated blood pressures with this pregnancy for weeks, has also been having headaches on and off.  Is going to college in [place], but back in this area now and is looking for an obstetrician to take care of her.  Went to labor and delivery just prior to arrival to the emergency department because of her headache and elevated blood pressure, however was referred on to the emergency department because she also is feeling short of breath.  Notes that her shortness of breath feels like she is wheezing and needs an albuterol  inhaler, is out of her prescription and needs a refill.  Denies any recent fevers, chills, sweats.  Has a cough that is nonproductive.  Denies any leg pain or swelling, rash.  Has on and off headaches but none currently.    PCP: Unknown, Provider    No current facility-administered medications for this encounter.     Current Outpatient Medications   Medication Sig Dispense Refill    albuterol  sulfate HFA (VENTOLIN  HFA) 108 (90 Base) MCG/ACT inhaler Inhale 2 puffs into the lungs 4 times daily as needed for Wheezing 18 g 0    valACYclovir (VALTREX) 500 MG tablet Take 1 tablet by mouth daily         Past History     Past Medical History:  Past Medical History:   Diagnosis Date    Concussion 2015    Concussion 2016    Concussion 2016    HSV (herpes simplex virus) infection        Past Surgical History:  Past Surgical History:   Procedure Laterality Date    DILATION AND CURETTAGE OF UTERUS      WISDOM TOOTH EXTRACTION         Family  History:  No family history on file.    Social History:       Allergies:  No Known Allergies      Review of Systems   Review of Systems  As per HPI    Physical Exam     ED Triage Vitals [08/24/23 1729]   BP Systolic BP Percentile Diastolic BP Percentile Temp Temp Source Pulse Respirations SpO2   (!) 128/99 -- -- 98.6 F (37 C) Oral 98 16 99 %      Height Weight - Scale         1.626 m (5' 4) 59 kg (130 lb)            Physical Exam  Vitals and nursing note reviewed.   Constitutional:       General: She is not in acute distress.     Appearance: She is well-developed and normal weight. She is not ill-appearing.   HENT:      Head: Normocephalic and atraumatic.      Mouth/Throat:      Mouth: Mucous membranes are moist.      Pharynx: Oropharynx is clear.   Eyes:      Extraocular  Movements: Extraocular movements intact.      Pupils: Pupils are equal, round, and reactive to light.   Cardiovascular:      Rate and Rhythm: Normal rate and regular rhythm.      Heart sounds: Normal heart sounds. No murmur heard.  Pulmonary:      Effort: Pulmonary effort is normal.      Breath sounds: Normal breath sounds. No wheezing.   Abdominal:      General: Bowel sounds are normal. There is no distension.      Palpations: Abdomen is soft.      Tenderness: There is no abdominal tenderness. There is no guarding or rebound.      Comments: Gravid uterus   Musculoskeletal:         General: Normal range of motion.      Cervical back: Normal range of motion and neck supple.      Right lower leg: No tenderness. No edema.      Left lower leg: No tenderness. No edema.   Skin:     General: Skin is warm and dry.      Capillary Refill: Capillary refill takes less than 2 seconds.      Findings: No rash.   Neurological:      General: No focal deficit present.      Mental Status: She is alert.   Psychiatric:         Behavior: Behavior normal.             Diagnostic Study Results     Labs -     Recent Results (from the past 12 hour(s))   CBC with Auto  Differential    Collection Time: 08/24/23  8:22 PM   Result Value Ref Range    WBC 11.5 (H) 4.0 - 11.0 1000/mm3    RBC 3.87 3.60 - 5.20 M/uL    Hemoglobin 10.4 (L) 11.0 - 16.0 gm/dl    Hematocrit 67.3 (L) 35.0 - 47.0 %    MCV 84.2 80.0 - 98.0 fL    MCH 26.9 25.4 - 34.6 pg    MCHC 31.9 30.0 - 36.0 gm/dl    Platelets 764 859 - 450 1000/mm3    MPV 11.4 (H) 6.0 - 10.0 fL    RDW 41.4 36.4 - 46.3      Nucleated RBCs 0 0 - 0      Immature Granulocytes % 0.4 0.0 - 3.0 %    Neutrophils Segmented 73.2 (H) 34 - 64 %    Lymphocytes 15.2 (L) 28 - 48 %    Monocytes 7.2 1 - 13 %    Eosinophils 3.9 0 - 5 %    Basophils 0.1 0 - 3 %   Protime-INR    Collection Time: 08/24/23  8:22 PM   Result Value Ref Range    Protime 11.4 10.2 - 12.9 seconds    INR 1.0 0.1 - 1.1     POCT Urinalysis no Micro    Collection Time: 08/24/23  8:30 PM   Result Value Ref Range    Glucose, Ur Negative NEGATIVE,Negative mg/dl    Bilirubin, Urine Negative NEGATIVE,Negative      Ketones, Urine Negative NEGATIVE,Negative mg/dl    Specific Gravity, UA 1.010 1.005 - 1.030      Blood, Urine Negative NEGATIVE,Negative      pH, Urine 7.0 5 - 9      Protein, Urine Negative NEGATIVE,Negative mg/dl    Urobilinogen, Urine 0.2 0.0 - 1.0 EU/dl  Nitrite, Urine Negative NEGATIVE,Negative      Leukocyte Esterase, Urine Negative NEGATIVE,Negative      Color, UA Yellow      Clarity, UA Clear       Lab interpretation by me shows white blood cell count at upper limit of normal, normal hemoglobin, urinalysis with no signs of infection.    Radiologic Studies -   XR CHEST PORTABLE   Final Result   Impression: Normal study.      Electronically signed by: Alm Schneider, MD 08/24/2023 8:14 PM EDT             Workstation ID: [id number]            Chest x-ray interpretation by me shows no acute cardiopulmonary disease.    Bedside Ultrasound    Date/Time: 08/24/2023 9:01 PM    Performed by: Jacklyn Lynwood POUR, MD  Authorized by: Jacklyn Lynwood POUR, MD    Verbal consent obtained: Yes    Given by:   Patient  Performed by:  Attending  Type of procedure:  Focused pelvic ultrasound obstetrical  Indications:  Pregnant by patient history  Transabdominal sagittal:  Adequate  Transabdominal transverse:  Adequate  Fetal Pole    Fetal heart    FHR (bpm):  130  Fetal motion    Interpretation:  Live intrauterine pregnancy  Bedside Ultrasound    Date/Time: 08/24/2023 9:02 PM    Performed by: Jacklyn Lynwood POUR, MD  Authorized by: Jacklyn Lynwood POUR, MD    Verbal consent obtained: Yes    Given by:  Patient  Performed by:  Attending  Type of procedure:  Focused cardiac (Echo)      Indications:  Shortness of breath    Subxiphoid (4 chamber):  Adequate    Parasternal long axis:  Adequate    Parasternal short axis:  Adequate    Subxiphoid (long axis, IVC view):  Adequate    Apical four-chamber:  Adequate    Pericardial effusion:  Absent    Global Ventricular Function:  Normal    Right Ventricular Size:  Normal    Interpretation:  No sonographic evidence of significant cardiac dysfunction and No sonographic evidence of significant pericardial effusion        Medical Decision Making   I am the first provider for this patient.    I reviewed the vital signs, available nursing notes, past medical history, past surgical history, family history and social history.    Vital Signs-Reviewed the patient's vital signs.    EKG:  Interpreted by the EP.   Time Interpreted: 8:50 PM   Rate: 95 bpm   Rhythm: Normal sinus rhythm   Interpretation: Nonspecific T wave changes    Records Reviewed: ED nursing notes    ED Course:      Remained stable during the time I was caring for her in the emergency department    Other considerations:   Severe exacerbation or progression of chronic illness: Pregnancy with uncontrolled hypertension and preeclampsia    Threat to body function without evaluation and management: Preeclampsia can lead to seizures and eclampsia and be life-threatening    SOCIAL DETERMINANTS impacting Evaluation and Management: stress, health  literacy, and access to healthcare provider    Comorbidities impacting Evaluation and Management: Patient's current pregnant status complicates her complaint of high blood pressure and headaches    Brief differential diagnosis includes pregnancy-induced hypertension, preeclampsia, eclampsia, asthma, pneumonia, pneumothorax, peripartum cardiomyopathy, congestive heart failure, and a host of acute cardiac and pulmonary and  pregnancy related complications    Disposition:  Discharged to labor and delivery unit    DISCHARGE NOTE:     Pt has been reexamined. Patient has no new complaints, changes, or physical findings.  Care plan outlined and precautions discussed.  Results of x-ray and ultrasound and EKG and labs were reviewed with the patient. All medications were reviewed with the patient; will d/c home with albuterol  inhaler. All of pt's questions and concerns were addressed. Patient was instructed and agrees to follow up with obstetrics, as well as to return to the ED upon further deterioration. Patient is ready to go home.    Follow-up:  No follow-up provider specified.         Medication List        START taking these medications      albuterol  sulfate HFA 108 (90 Base) MCG/ACT inhaler  Commonly known as: Ventolin  HFA  Inhale 2 puffs into the lungs 4 times daily as needed for Wheezing            ASK your doctor about these medications      valACYclovir 500 MG tablet  Commonly known as: VALTREX               Where to Get Your Medications        These medications were sent to [id number] - [place], [place] - [address] - P [phone] [facility] [phone]  [address], [place] [address]      Phone: [phone]   albuterol  sulfate HFA 108 (90 Base) MCG/ACT inhaler           Medical Decision Making   22 year old woman with shortness of breath, no evidence of pneumonia or pneumothorax or cardiomyopathy or congestive heart failure.  This feels like her asthma and she feels better after neb  treatment, will give albuterol  inhaler for home use.  Also with headaches and elevated blood pressure consistent with preeclampsia, appreciate OB/GYN consult as below and plan for them to follow the rest of her labs and further observe and manage.    Consult:  Discussed care with Dr. Mayo, Specialty: OBGYN.  Standard discussion; including history of patient's chief complaint, available diagnostic results, and treatment course. Will see in labor and delivery now; labs ordered here -- she will follow results. ~8:15pm, Lynwood MARLA Roll, MD       Diagnosis     Clinical Impression:     ICD-10-CM    1. Pre-eclampsia in third trimester  O14.93       2. Shortness of breath  R06.02       3. Acute nonintractable headache, unspecified headache type  R51.9       4. Mild intermittent asthma without complication  J45.20                   Roll Lynwood MARLA, MD  08/24/23 2104

## 2023-08-24 NOTE — Progress Notes (Addendum)
 [redacted]w[redacted]d patient arrived to unit via wheelchair accompanied by emergency room staff member. Patient was seen downstairs for SOB but has since been discharged. Patient verbalized to this RN that she was dx with GHTN and usually receives care from her OB in [place]. She has an appointment with Dr. Elnor later this month. Patient currently denies HA, visual changes or epigastric pain but says when she does have headaches she gets visual changes with them and Tylenol  does not help. Patient has felt fetal movement but states its less then normal.     2132- Dr. Mayo at bedside to assess patient.     2135- This RN called lab to check on urine PCR. Lab stated they had urine specimen, it just needs to be released. RN released urine.     2235- Dr. Mayo reviewed blood pressures and lab results with RN. Patient okay to be discharged.     2240- PIV in left AC placed by emergency room staff, removed by this RN.    2245- Discharge instructions printed and reviewed with patient. Patient had no questions.    2250- Patient ambulated off unit with her discharge instructions.

## 2023-08-24 NOTE — ED Notes (Signed)
 [facility]  Obstetrics Emergency Department Treatment Note        Patient: Kylie [name] Age: 22 y.o. Sex: female    Date of Birth: [birth year] Admit Date: 08/24/2023 PCP: Unknown, Provider   MRN: [id number]  CSN: [id number]     Room: 3114/3114 Time Dictated: 9:51 PM        Chief Complaint   Chief Complaint   Patient presents with    Hypertension     Patient dx with GHTN.         History of Present Illness   Kylie [name] is a 22 y.o. G3P0020 with an estimated gestational age of [redacted]w[redacted]d with Estimated Date of Delivery: 10/22/23. She presents to ED for shortness of breath and was treated for asthma. Patient has hx of gestational  hypertension and has been receiving treatment in [place], [place]. Patient has an appt with Dr Elnor Hoops on Sept 24. Patient still receiving care in [place], [place] but her mom leaves here and she wanted to be close to home    Prenatal care by:   [facility] [place]   PNC:  HSV- on Valtrex 500mg   Gestational hypertension  IUGR    PNL:      OB History   Gravida Para Term Preterm AB Living   3       2     SAB IAB Ectopic Molar Multiple Live Births     2              # Outcome Date GA Lbr Len/2nd Weight Sex Type Anes PTL Lv   3 Current            2 IAB            1 IAB                 Review of Systems   Review of Systems   Constitutional: Negative.  Negative for fever.   Genitourinary: Negative.  Negative for pelvic pain, vaginal bleeding and vaginal discharge.        Past Medical/Surgical History     Past Medical History:   Diagnosis Date    Concussion 2015    Concussion 2016    Concussion 2016    HSV (herpes simplex virus) infection      Past Surgical History:   Procedure Laterality Date    DILATION AND CURETTAGE OF UTERUS      WISDOM TOOTH EXTRACTION         Social History     Social History     Socioeconomic History    Marital status: Single     Spouse name: Not on file    Number of children: Not on file    Years of education: Not on file    Highest education level: Not on file    Occupational History    Not on file   Tobacco Use    Smoking status: Not on file    Smokeless tobacco: Not on file   Substance and Sexual Activity    Alcohol use: Not on file    Drug use: Not on file    Sexual activity: Not on file   Other Topics Concern    Not on file   Social History Narrative    Not on file     Social Determinants of Health     Financial Resource Strain: Not on file   Food Insecurity: Low Risk  (08/17/2023)    Received from  [facility]    Hunger Vital Sign     Worried About Running Out of Food in the Last Year: Never true     Ran Out of Food in the Last Year: Never true   Transportation Needs: No Transportation Needs (08/17/2023)    Received from [employer]     In the past 12 months, has lack of reliable transportation kept you from medical appointments, meetings, work or from getting things needed for daily living? : No   Physical Activity: Not on file   Stress: Not on file   Social Connections: Unknown (04/25/2022)    Received from [facility]    Social Network     Social Network: Not on file   Intimate Partner Violence: Not At Risk (06/06/2023)    Received from [facility]    Humiliation, Afraid, Rape, and Kick questionnaire     Fear of Current or Ex-Partner: No     Emotionally Abused: No     Physically Abused: No     Sexually Abused: No   Housing Stability: Low Risk  (08/17/2023)    Received from [employer] Stability Vital Sign     What is your living situation today?: I have a steady place to live     Think about the place you live. Do you have problems with any of the following? Choose all that apply:: None/None on this list       Family History   No family history on file.    Current Medications     No current facility-administered medications for this encounter.     Current Outpatient Medications   Medication Sig Dispense Refill    albuterol  sulfate HFA (VENTOLIN  HFA) 108 (90 Base) MCG/ACT inhaler Inhale 2 puffs into the lungs 4 times daily as needed for  Wheezing 18 g 0    valACYclovir (VALTREX) 500 MG tablet Take 1 tablet by mouth daily          Allergies   No Known Allergies    Physical Exam   Patient Vitals for the past 24 hrs:   BP Temp Temp src Pulse Resp SpO2 Height Weight   08/24/23 2146 (!) 142/100 -- -- (!) 101 -- 100 % -- --   08/24/23 2133 127/89 98.7 F (37.1 C) Oral (!) 107 18 100 % -- --   08/24/23 2041 -- -- -- 78 16 100 % -- --   08/24/23 1729 (!) 128/99 98.6 F (37 C) Oral 98 16 99 % 1.626 m (5' 4) 59 kg (130 lb)      ED Triage Vitals [08/24/23 1729]   Encounter Vitals Group      BP (!) 128/99      Systolic BP Percentile       Diastolic BP Percentile       Pulse 98      Respirations 16      Temp 98.6 F (37 C)      Temp Source Oral      SpO2 99 %      Weight - Scale 59 kg (130 lb)      Height 1.626 m (5' 4)      Head Circumference       Peak Flow       Pain Score       Pain Loc       Pain Education       Exclude from Growth Chart  Physical Exam  Cardiovascular:      Rate and Rhythm: Normal rate.      Pulses: Normal pulses.   Pulmonary:      Effort: Pulmonary effort is normal.   Neurological:      Mental Status: She is alert.           OB Exam     Cervical Exam: deferred  Uterine Activity: none  Fetal Heart Rate: 140, category 1 and reactive/reassuring, moderate variability, accelerations present, no decelerations  I personally reviewed and interpreted the fetal non-stress test.         Diagnostic Studies   Lab:   Recent Results (from the past 12 hour(s))   CBC with Auto Differential    Collection Time: 08/24/23  8:22 PM   Result Value Ref Range    WBC 11.5 (H) 4.0 - 11.0 1000/mm3    RBC 3.87 3.60 - 5.20 M/uL    Hemoglobin 10.4 (L) 11.0 - 16.0 gm/dl    Hematocrit 67.3 (L) 35.0 - 47.0 %    MCV 84.2 80.0 - 98.0 fL    MCH 26.9 25.4 - 34.6 pg    MCHC 31.9 30.0 - 36.0 gm/dl    Platelets 764 859 - 450 1000/mm3    MPV 11.4 (H) 6.0 - 10.0 fL    RDW 41.4 36.4 - 46.3      Nucleated RBCs 0 0 - 0      Immature Granulocytes % 0.4 0.0 - 3.0 %     Neutrophils Segmented 73.2 (H) 34 - 64 %    Lymphocytes 15.2 (L) 28 - 48 %    Monocytes 7.2 1 - 13 %    Eosinophils 3.9 0 - 5 %    Basophils 0.1 0 - 3 %   Basic Metabolic Panel    Collection Time: 08/24/23  8:22 PM   Result Value Ref Range    Potassium 3.9 3.5 - 5.1 mEq/L    Chloride 106 98 - 107 mEq/L    Sodium 138 136 - 145 mEq/L    CO2 23 20 - 31 mEq/L    Glucose 79 74 - 106 mg/dl    BUN <5 (L) 9 - 23 mg/dl    Creatinine 9.83 (L) 0.55 - 1.02 mg/dl    GFR African American >60.0      GFR Non-African American >60      Calcium 9.0 8.7 - 10.4 mg/dl    Anion Gap 9 5 - 15 mmol/L   Hepatic Function Panel    Collection Time: 08/24/23  8:22 PM   Result Value Ref Range    AST 17.0 0.0 - 33.9 U/L    ALT 9 (L) 10 - 49 U/L    Alkaline Phosphatase 154 (H) 46 - 116 U/L    Total Bilirubin 0.40 0.30 - 1.20 mg/dl    Total Protein 7.1 5.7 - 8.2 gm/dl    Albumin 3.1 (L) 3.4 - 5.0 gm/dl    Bilirubin, Direct <9.8 0.0 - 0.3 mg/dl   Magnesium    Collection Time: 08/24/23  8:22 PM   Result Value Ref Range    Magnesium 1.7 1.6 - 2.6 mg/dL   proBNP, N-TERMINAL    Collection Time: 08/24/23  8:22 PM   Result Value Ref Range    NT Pro-BNP 24 0 - 125     Procalcitonin    Collection Time: 08/24/23  8:22 PM   Result Value Ref Range    Procalcitonin <0.05 0.00 - 0.50  ng/ml   Protime-INR    Collection Time: 08/24/23  8:22 PM   Result Value Ref Range    Protime 11.4 10.2 - 12.9 seconds    INR 1.0 0.1 - 1.1     Troponin    Collection Time: 08/24/23  8:22 PM   Result Value Ref Range    Troponin, High Sensitivity <3 0 - 34 ng/L   Protein / creatinine ratio, urine    Collection Time: 08/24/23  8:22 PM   Result Value Ref Range    Creatinine, Random Urine 72.3 30.0 - 125.0 mg/dl    Protein, Urine, Random 9.9 1.0 - 14.0 mg/dl    Urine Total Protein Creatinine Ratio 0.14 0.00 - 0.19 gm/24 Hr   Uric Acid    Collection Time: 08/24/23  8:22 PM   Result Value Ref Range    Uric Acid 4.1 3.1 - 7.8 mg/dl   POCT Urinalysis no Micro    Collection Time: 08/24/23   8:30 PM   Result Value Ref Range    Glucose, Ur Negative NEGATIVE,Negative mg/dl    Bilirubin, Urine Negative NEGATIVE,Negative      Ketones, Urine Negative NEGATIVE,Negative mg/dl    Specific Gravity, UA 1.010 1.005 - 1.030      Blood, Urine Negative NEGATIVE,Negative      pH, Urine 7.0 5 - 9      Protein, Urine Negative NEGATIVE,Negative mg/dl    Urobilinogen, Urine 0.2 0.0 - 1.0 EU/dl    Nitrite, Urine Negative NEGATIVE,Negative      Leukocyte Esterase, Urine Negative NEGATIVE,Negative      Color, UA Yellow      Clarity, UA Clear       Labs Reviewed   CBC WITH AUTO DIFFERENTIAL - Abnormal; Notable for the following components:       Result Value    WBC 11.5 (*)     Hemoglobin 10.4 (*)     Hematocrit 32.6 (*)     MPV 11.4 (*)     Neutrophils Segmented 73.2 (*)     Lymphocytes 15.2 (*)     All other components within normal limits   BASIC METABOLIC PANEL - Abnormal; Notable for the following components:    BUN <5 (*)     Creatinine 0.16 (*)     All other components within normal limits   HEPATIC FUNCTION PANEL - Abnormal; Notable for the following components:    ALT 9 (*)     Alkaline Phosphatase 154 (*)     Albumin 3.1 (*)     All other components within normal limits   RESPIRATORY PATHOGEN PANEL   MAGNESIUM   PROBNP, N-TERMINAL   PROCALCITONIN   PROTIME-INR   TROPONIN   PROTEIN / CREATININE RATIO, URINE   URIC ACID   TROPONIN   POCT URINALYSIS DIPSTICK         Imaging:    XR CHEST PORTABLE    Result Date: 08/24/2023  Indication: Short of breath Frontal view chest The heart is normal in size. Lungs are clear and the bony thorax is intact. No effusions or other abnormalities are seen.     Impression: Normal study. Electronically signed by: Alm Schneider, MD 08/24/2023 8:14 PM EDT          Workstation ID: [id number]        ED Course     BP (!) 142/100 Comment: Dr. Mayo at bedside discussing POC with patient. patient talking during this BP. Will recheck  Pulse (!) 101  Temp 98.7 F (37.1 C) (Oral)   Resp 18    Ht 1.626 m (5' 4)   Wt 59 kg (130 lb)   SpO2 100%   BMI 22.31 kg/m            Medications   ipratropium 0.5 mg-albuterol  2.5 mg (DUONEB ) nebulizer solution 1 Dose (1 Dose Inhalation Given 08/24/23 2039)       Medical Decision Making   GHTN- NST, PIH panel found to be normal   Asthma treated with ED and sent albuterol  inhaler    Final Diagnosis / MDM     IUP [redacted]w[redacted]d  GHTN    Disposition   Discharge home  Patient was encourage to follow up with Roselie until her appt for close follow up         Soundra LULLA Parlor, MD  [facility]  August 24, 2023  9:51 PM      My signature above authenticates this document and my orders, the final    diagnosis(es), discharge prescription(s), and instructions in the Epic    record.  If you have any questions please contact [phone].  Nursing notes have been reviewed by myself.

## 2023-08-24 NOTE — ED Triage Notes (Signed)
 C/o high BP w/lower abd pressure x 3 days   Sent from OB, [redacted] weeks pregnant, stated noticed a decrease in infant movement since BP has been elevated  L&D wanted pt evaluated in ED  Stated increasing SOB since w/inhaler prescribed and not effective

## 2023-08-25 LAB — PROTEIN / CREATININE RATIO, URINE
Creatinine, Random Urine: 72.3 mg/dL (ref 30.0–125.0)
Protein, Urine, Random: 9.9 mg/dL (ref 1.0–14.0)
Urine Total Protein Creatinine Ratio: 0.14 g/(24.h) (ref 0.00–0.19)

## 2023-08-25 LAB — BASIC METABOLIC PANEL
Anion Gap: 9 mmol/L (ref 5–15)
BUN: <5 mg/dL — ABNORMAL LOW (ref 9–23)
CO2: 23 meq/L (ref 20–31)
Calcium: 9 mg/dL (ref 8.7–10.4)
Chloride: 106 meq/L (ref 98–107)
Creatinine: 0.16 mg/dL — ABNORMAL LOW (ref 0.55–1.02)
GFR African American: >60
GFR Non-African American: >60
Glucose: 79 mg/dL (ref 74–106)
Potassium: 3.9 meq/L (ref 3.5–5.1)
Sodium: 138 meq/L (ref 136–145)

## 2023-08-25 LAB — EKG 12-LEAD
Atrial Rate: 95 {beats}/min
Calculated R Axis: -48 degrees
Calculated T Axis: -66 degrees
DIAGNOSIS, 93000: NORMAL
P-R Interval: 120 ms
Q-T Interval: 342 ms
QRS Duration: 64 ms
QTC Calculation (Bezet): 429 ms
Ventricular Rate: 95 {beats}/min

## 2023-08-25 LAB — CBC WITH AUTO DIFFERENTIAL
Basophils: 0.1 % (ref 0–3)
Eosinophils: 3.9 % (ref 0–5)
Hematocrit: 32.6 % — ABNORMAL LOW (ref 35.0–47.0)
Hemoglobin: 10.4 g/dL — ABNORMAL LOW (ref 11.0–16.0)
Immature Granulocytes %: 0.4 % (ref 0.0–3.0)
Lymphocytes: 15.2 % — ABNORMAL LOW (ref 28–48)
MCH: 26.9 pg (ref 25.4–34.6)
MCHC: 31.9 g/dL (ref 30.0–36.0)
MCV: 84.2 fL (ref 80.0–98.0)
MPV: 11.4 fL — ABNORMAL HIGH (ref 6.0–10.0)
Monocytes: 7.2 % (ref 1–13)
Neutrophils Segmented: 73.2 % — ABNORMAL HIGH (ref 34–64)
Nucleated RBCs: 0 (ref 0–0)
Platelets: 235 10*3/uL (ref 140–450)
RBC: 3.87 M/uL (ref 3.60–5.20)
RDW: 41.4 (ref 36.4–46.3)
WBC: 11.5 10*3/uL — ABNORMAL HIGH (ref 4.0–11.0)

## 2023-08-25 LAB — HEPATIC FUNCTION PANEL
ALT: 9 U/L — ABNORMAL LOW (ref 10–49)
AST: 17 U/L (ref 0.0–33.9)
Albumin: 3.1 g/dL — ABNORMAL LOW (ref 3.4–5.0)
Alkaline Phosphatase: 154 U/L — ABNORMAL HIGH (ref 46–116)
Bilirubin, Direct: <0.1 mg/dL (ref 0.0–0.3)
Total Bilirubin: 0.4 mg/dL (ref 0.30–1.20)
Total Protein: 7.1 g/dL (ref 5.7–8.2)

## 2023-08-25 LAB — POCT URINALYSIS DIPSTICK
Bilirubin, Urine: NEGATIVE
Blood, Urine: NEGATIVE
Glucose, Ur: NEGATIVE mg/dL
Ketones, Urine: NEGATIVE mg/dL
Leukocyte Esterase, Urine: NEGATIVE
Nitrite, Urine: NEGATIVE
Protein, Urine: NEGATIVE mg/dL
Specific Gravity, UA: 1.01 (ref 1.005–1.030)
Urobilinogen, Urine: 0.2 EU/dl (ref 0.0–1.0)
pH, Urine: 7 (ref 5–9)

## 2023-08-25 LAB — PROBNP, N-TERMINAL: NT Pro-BNP: 24 (ref 0–125)

## 2023-08-25 LAB — PROCALCITONIN: Procalcitonin: <0.05 ng/mL (ref 0.00–0.50)

## 2023-08-25 LAB — TROPONIN: Troponin, High Sensitivity: <3 ng/L (ref 0–34)

## 2023-08-25 LAB — PROTIME-INR
INR: 1 (ref 0.1–1.1)
Protime: 11.4 s (ref 10.2–12.9)

## 2023-08-25 LAB — MAGNESIUM: Magnesium: 1.7 mg/dL (ref 1.6–2.6)

## 2023-08-25 LAB — URIC ACID: Uric Acid: 4.1 mg/dL (ref 3.1–7.8)

## 2023-08-25 MED ORDER — IPRATROPIUM-ALBUTEROL 0.5-2.5 (3) MG/3ML IN SOLN
0.5-2.5 | RESPIRATORY_TRACT | Status: AC
Start: 2023-08-25 — End: 2023-08-24

## 2023-08-25 MED ORDER — ALBUTEROL SULFATE HFA 108 (90 BASE) MCG/ACT IN AERS
108 | Freq: Four times a day (QID) | RESPIRATORY_TRACT | 0 refills | Status: AC | PRN
Start: 2023-08-25 — End: ?

## 2023-08-25 MED ADMIN — ipratropium 0.5 mg-albuterol 2.5 mg (DUONEB) nebulizer solution 1 Dose: 1 | RESPIRATORY_TRACT | @ 01:00:00 | NDC 60687040579

## 2023-08-25 MED FILL — IPRATROPIUM-ALBUTEROL 0.5-2.5 (3) MG/3ML IN SOLN: RESPIRATORY_TRACT | Qty: 3

## 2023-09-06 ENCOUNTER — Inpatient Hospital Stay: Payer: PRIVATE HEALTH INSURANCE | Attending: Obstetrics & Gynecology

## 2023-09-06 LAB — COMPREHENSIVE METABOLIC PANEL
ALT: <7 U/L — ABNORMAL LOW (ref 10–49)
AST: 15 U/L (ref 0.0–33.9)
Albumin: 2.9 g/dL — ABNORMAL LOW (ref 3.4–5.0)
Alkaline Phosphatase: 165 U/L — ABNORMAL HIGH (ref 46–116)
Anion Gap: 6 mmol/L (ref 5–15)
BUN: 6 mg/dL — ABNORMAL LOW (ref 9–23)
CO2: 22 meq/L (ref 20–31)
Calcium: 8.8 mg/dL (ref 8.7–10.4)
Chloride: 109 meq/L — ABNORMAL HIGH (ref 98–107)
Creatinine: 0.78 mg/dL (ref 0.55–1.02)
GFR African American: >60
GFR Non-African American: >60
Glucose: 120 mg/dL — ABNORMAL HIGH (ref 74–106)
Potassium: 3.6 meq/L (ref 3.5–5.1)
Sodium: 137 meq/L (ref 136–145)
Total Bilirubin: 0.4 mg/dL (ref 0.30–1.20)
Total Protein: 6.6 g/dL (ref 5.7–8.2)

## 2023-09-06 LAB — PROTEIN / CREATININE RATIO, URINE
Creatinine, Random Urine: 103.7 mg/dL (ref 30.0–125.0)
Protein, Urine, Random: 16.2 mg/dL — ABNORMAL HIGH (ref 1.0–14.0)
Urine Total Protein Creatinine Ratio: 0.16 g/(24.h) (ref 0.00–0.19)

## 2023-09-06 LAB — URIC ACID: Uric Acid: 4.5 mg/dL (ref 3.1–7.8)

## 2023-09-06 LAB — CBC
Hematocrit: 30.3 % — ABNORMAL LOW (ref 35.0–47.0)
Hemoglobin: 9.9 g/dL — ABNORMAL LOW (ref 11.0–16.0)
MCH: 26.6 pg (ref 25.4–34.6)
MCHC: 32.7 g/dL (ref 30.0–36.0)
MCV: 81.5 fL (ref 80.0–98.0)
MPV: 12 fL — ABNORMAL HIGH (ref 6.0–10.0)
Platelets: 222 10*3/uL (ref 140–450)
RBC: 3.72 M/uL (ref 3.60–5.20)
RDW: 40.9 (ref 36.4–46.3)
WBC: 9.8 10*3/uL (ref 4.0–11.0)

## 2023-09-06 LAB — ANTIBODY SCREEN: Antibody Screen: NEGATIVE

## 2023-09-06 LAB — ABO/RH: ABO/Rh: O POS

## 2023-09-06 LAB — LACTATE DEHYDROGENASE: LD: 216 U/L (ref 120–246)

## 2023-09-06 MED ORDER — BUTALBITAL-APAP-CAFFEINE 50-325-40 MG PO TABS
50-325-40 | ORAL_TABLET | ORAL | 0 refills | Status: AC | PRN
Start: 2023-09-06 — End: ?

## 2023-09-06 NOTE — Progress Notes (Addendum)
 1452 G3P0020 [redacted]w[redacted]d presents to [facility] s/p office visit where pt states her BP was 142/90 and pt c/o HA since last night. PT states HA unrelieved with tylenol  (1000mg ) Pt endorses +fm, denies LOF or vaginal bleeding. Pt denies ctx at this time. EFM and toco applied and explained.     1523 IV placed right AC, blood sent to lab for processing.    1620 Spoke with Dr Valorie via telephone, discharge orders received with prescription for fioricet  for HA to be picked up from preferred pharmacy.    [id number] Discharge instructions reviewed with patient, patient made aware that prescription sent to preferred pharmacy, provided opportunity for questions. Patient ambulated to exit undelivered and without assistance. All belongings in patient possession.

## 2023-09-06 NOTE — Discharge Instructions (Signed)
 Home Undelivered Discharge Instructions    After Discharge Orders:    Keep all OB appointments              Diet:  normal diet as tolerated    Rest: normal activity as tolerated    Other instructions: Do kick counts once a day on your baby. Choose the time of day your baby is most active. Get in a comfortable lying or sitting position and time how long it takes to feel 10 kicks, twists, turns, swishes, or rolls. Call your physician or midwife if there have not been 10 kicks in 2 hours    Call physician or midwife, return to Labor and Delivery, call 911, or go to the nearest Emergency Room if: increased leakage or fluid, decreased fetal movement, persistent low back pain or cramping, bleeding from vaginal area, persistent headache, vision change, or contractions every 4-5 minutes lasting for 1 minute for 1 hour.

## 2023-09-06 NOTE — Progress Notes (Signed)
 Obstetrics Antepartum Note        Patient: Kylie [name] Age: 22 y.o. Sex: female    Date of Birth: Aug 19, 2001 Admit Date: 09/06/2023 PCP: Unknown, Provider   MRN: [id number]  CSN: [id number]     Room: 3104/3104 Time Dictated: 3:39 PM        History of Present Illness   The patient is a 22 year old G3P0020 at [redacted]w[redacted]d with suspected cHTN who was seen in the office today for a scheduled NST  She reported feeling dizziness and  having a headache unrelieved by Tylenol .      She denies LOF, vaginal bleeding, contractions, epigastric pain, or visual disturbance.Reports good fetal movement     Prenatal Problem List from Prenatal:  Anxiety  HTN  Anemia  Herpes      OB History   Gravida Para Term Preterm AB Living   3       2     SAB IAB Ectopic Molar Multiple Live Births     2              # Outcome Date GA Lbr Len/2nd Weight Sex Type Anes PTL Lv   3 Current            2 IAB            1 IAB                 Review of Systems     Constitutional: Negative for chills and fever.   Eyes: Negative for blurred vision.   Respiratory: Negative for cough, shortness of breath and wheezing.    Cardiovascular: Negative for chest pain, palpitations and leg swelling.   Gastrointestinal: Negative for abdominal pain, heartburn, nausea and vomiting.   Genitourinary: Negative for dysuria.   Musculoskeletal: Negative for back pain.   Skin: Negative for itching and rash.   Neurological: Negative for dizziness. Positive for headaches.  Past Medical/Surgical History     Past Medical History:   Diagnosis Date    Concussion 2015    Concussion 2016    Concussion 2016    HSV (herpes simplex virus) infection      Past Surgical History:   Procedure Laterality Date    DILATION AND CURETTAGE OF UTERUS      WISDOM TOOTH EXTRACTION         Social History     Social History     Socioeconomic History    Marital status: Single     Spouse name: Not on file    Number of children: Not on file    Years of education: Not on file    Highest education level: Not on file    Occupational History    Not on file   Tobacco Use    Smoking status: Never    Smokeless tobacco: Never   Substance and Sexual Activity    Alcohol use: Not Currently    Drug use: Not Currently    Sexual activity: Not on file   Other Topics Concern    Not on file   Social History Narrative    Not on file     Social Determinants of Health     Financial Resource Strain: Not on file   Food Insecurity: Low Risk  (08/17/2023)    Received from [facility]    Hunger Vital Sign     Worried About Running Out of Food in the Last Year: Never true     Ran Out of  Food in the Last Year: Never true   Transportation Needs: No Transportation Needs (08/17/2023)    Received from [employer]     In the past 12 months, has lack of reliable transportation kept you from medical appointments, meetings, work or from getting things needed for daily living? : No   Physical Activity: Not on file   Stress: Not on file   Social Connections: Unknown (04/25/2022)    Received from [facility]    Social Network     Social Network: Not on file   Intimate Partner Violence: Not At Risk (06/06/2023)    Received from [facility]    Humiliation, Afraid, Rape, and Kick questionnaire     Fear of Current or Ex-Partner: No     Emotionally Abused: No     Physically Abused: No     Sexually Abused: No   Housing Stability: Low Risk  (08/17/2023)    Received from [employer] [facility] Vital Sign     What is your living situation today?: I have a steady place to live     Think about the place you live. Do you have problems with any of the following? Choose all that apply:: None/None on this list       Family History   History reviewed. No pertinent family history.    Current Medications     No current facility-administered medications for this encounter.        Allergies   No Known Allergies    Physical Exam   Patient Vitals for the past 24 hrs:   BP Pulse SpO2   09/06/23 1520 -- -- 98 %   09/06/23 1517 117/68 (!) 107 --      Vitals:     09/06/23 1517 09/06/23 1520 09/06/23 1530 09/06/23 1545   BP: 117/68  123/66 120/71   Pulse: (!) 107  99 (!) 101   SpO2:  98% 99% 100%        General:  Pt AAOx3, No acute distress, normal mood and affect.    Neuro:  Grossly Intact  Skin:  Warm and Dry  Lungs: Unlabored breathing, regular rate  Heart:  Regular rate and rhythm  Abdomen: Soft and Non tender, Gravid    Fetal Assessment   NST Interpretation:   Reactive    Diagnostic Studies   Lab:  Recent Results (from the past 24 hour(s))   Protein / creatinine ratio, urine    Collection Time: 09/06/23  3:10 PM   Result Value Ref Range    Creatinine, Random Urine 103.7 30.0 - 125.0 mg/dl    Protein, Urine, Random 16.2 (H) 1.0 - 14.0 mg/dl    Urine Total Protein Creatinine Ratio 0.16 0.00 - 0.19 gm/24 Hr   CBC    Collection Time: 09/06/23  3:20 PM   Result Value Ref Range    WBC 9.8 4.0 - 11.0 1000/mm3    RBC 3.72 3.60 - 5.20 M/uL    Hemoglobin 9.9 (L) 11.0 - 16.0 gm/dl    Hematocrit 69.6 (L) 35.0 - 47.0 %    MCV 81.5 80.0 - 98.0 fL    MCH 26.6 25.4 - 34.6 pg    MCHC 32.7 30.0 - 36.0 gm/dl    Platelets 777 859 - 450 1000/mm3    MPV 12.0 (H) 6.0 - 10.0 fL    RDW 40.9 36.4 - 46.3     Comprehensive Metabolic Panel    Collection Time:  09/06/23  3:20 PM   Result Value Ref Range    Potassium 3.6 3.5 - 5.1 mEq/L    Chloride 109 (H) 98 - 107 mEq/L    Sodium 137 136 - 145 mEq/L    CO2 22 20 - 31 mEq/L    Glucose 120 (H) 74 - 106 mg/dl    BUN 6 (L) 9 - 23 mg/dl    Creatinine 9.21 9.44 - 1.02 mg/dl    GFR African American >60.0      GFR Non-African American >60      Calcium 8.8 8.7 - 10.4 mg/dl    Anion Gap 6 5 - 15 mmol/L    AST 15.0 0.0 - 33.9 U/L    ALT <7 (L) 10 - 49 U/L    Alkaline Phosphatase 165 (H) 46 - 116 U/L    Total Bilirubin 0.40 0.30 - 1.20 mg/dl    Total Protein 6.6 5.7 - 8.2 gm/dl    Albumin 2.9 (L) 3.4 - 5.0 gm/dl   Lactate Dehydrogenase    Collection Time: 09/06/23  3:20 PM   Result Value Ref Range    LD 216 120 - 246 U/L   Uric Acid    Collection Time:  09/06/23  3:20 PM   Result Value Ref Range    Uric Acid 4.5 3.1 - 7.8 mg/dl   Imaging:    No results found.         Medications - No data to display  Impression and Management Plan/ Medical Decision Making   BPs all wnl  Labs all WNL  UPC 0.16  Headache    Disposition   Rx for Fioricet  sent    Rock Quirk, MD  [facility]  September 06, 2023  3:39 PM

## 2023-09-06 NOTE — Procedures (Signed)
 NST Procedure Note    Patient: Kylie [name]               Sex: female          DOA: 09/06/2023       Date of Birth:  [birth year]      Age:  22 y.o.        LOS:  LOS: 0 days     MRN: [id number]                    CSN: [id number]      Estimated Gestational Age:[redacted]w[redacted]d     Indication for NST: Elevated BP reading in office  Primary OB:  Dr. Valorie    Fetal Vital Signs:  Mode: External  Fetal Heart Rate:150s  Variability:Moderate 6-25 bpm  Decelerations: No  Accelerations:{Yes / No :Yes  FHR Interpretations:Category I    Non-Stress Test: obgyn fetal nst findings: reactive    Uterine Activity:  Mode: External  Frequency : occasional        Provided NST Interpretation only; assessment and care of patient via RN and primary MD    Signed ab:[email or web address] S Yatzari Jonsson, APRN - CNM  09/06/2023 4:01 PM

## 2023-09-07 LAB — RPR W/REFLEX TITER AND CONFIRMATION: RPR: NONREACTIVE

## 2023-10-14 DIAGNOSIS — J020 Streptococcal pharyngitis: Secondary | ICD-10-CM

## 2023-10-14 NOTE — ED Provider Notes (Signed)
[facility] Care  Emergency Department Treatment Report        Patient: Kylie [name] Age: 22 y.o. Sex: female    Date of Birth: [birth year] Admit Date: 10/14/2023 PCP: Unknown, Provider, MD   MRN: [id number]  CSN: [id number]  Dr. Despina Hick, MD   Room: H09/H09 Time Dictated: 11:09 PM            Chief Complaint   Chief Complaint   Patient presents with    Fever    Pharyngitis    Hypertension       History of Present Illness   This is a 22 y.o. female with history of preeclampsia,   Discharge blood pressure October 21 was 127/81,-[place] where she delivered October 14, with induction of labor, spontaneous vaginal delivery, discharge hemoglobin was 8.4, total bilirubin is 0.2, alk phos 151, AST ALT normal.    She is currently visiting from out of town, and states she started to feel ill on Friday so she did not take her blood pressure medication, she began to have a fever as high as 102 and a sore throat and a headache.  She denies runny nose or coughing she denies ear pain she denies abdominal pain or dysuria she denies diarrhea or vaginal discomfort, or abnormal discharge.  She has no current vaginal bleeding.  She has had 2 episodes of nausea and vomiting today and does have a headache although it is not the severe worst headache of her life, it was gradual in onset, it was not thunderclap.  She is here with her father and her infant, she is currently breast-feeding she denies breast pain redness or swelling.    Review of Systems   Review of Systems    See HPI  Past Medical/Surgical History     Past Medical History:   Diagnosis Date    Concussion 2015    Concussion 2016    Concussion 2016    HSV (herpes simplex virus) infection      Past Surgical History:   Procedure Laterality Date    DILATION AND CURETTAGE OF UTERUS      WISDOM TOOTH EXTRACTION         Social History     Social History     Socioeconomic History    Marital status: Single     Spouse name: Not on file    Number of  children: Not on file    Years of education: Not on file    Highest education level: Not on file   Occupational History    Not on file   Tobacco Use    Smoking status: Never    Smokeless tobacco: Never   Substance and Sexual Activity    Alcohol use: Not Currently    Drug use: Not Currently    Sexual activity: Not on file   Other Topics Concern    Not on file   Social History Narrative    Not on file     Social Determinants of Health     Financial Resource Strain: Not on file   Food Insecurity: Low Risk  (09/24/2023)    Received from [facility]    Hunger Vital Sign     Worried About Running Out of Food in the Last Year: Never true     Ran Out of Food in the Last Year: Never true   Transportation Needs: No Transportation Needs (09/24/2023)    Received from [employer]  In the past 12 months, has lack of reliable transportation kept you from medical appointments, meetings, work or from getting things needed for daily living? : No   Physical Activity: Not on file   Stress: Not on file   Social Connections: Unknown (04/25/2022)    Received from [facility]    Social Network     Social Network: Not on file   Intimate Partner Violence: Not At Risk (06/06/2023)    Received from [facility]    Humiliation, Afraid, Rape, and Kick questionnaire     Fear of Current or Ex-Partner: No     Emotionally Abused: No     Physically Abused: No     Sexually Abused: No   Housing Stability: Low Risk  (09/24/2023)    Received from [facility]    Housing Stability Vital Sign     What is your living situation today?: I have a steady place to live     Think about the place you live. Do you have problems with any of the following? Choose all that apply:: None/None on this list     **  Family History   No family history on file.  **  Current Medications     No current facility-administered medications for this encounter.     Current Outpatient Medications   Medication Sig Dispense Refill    amoxicillin (AMOXIL) 500  MG capsule Take 1 capsule by mouth 2 times daily for 10 days 20 capsule 0    labetalol (NORMODYNE) 200 MG tablet Take 1 tablet by mouth 2 times daily 60 tablet 3    butalbital-acetaminophen-caffeine (FIORICET, ESGIC) 50-325-40 MG per tablet Take 1 tablet by mouth every 4 hours as needed for Headaches 20 tablet 0    albuterol sulfate HFA (VENTOLIN HFA) 108 (90 Base) MCG/ACT inhaler Inhale 2 puffs into the lungs 4 times daily as needed for Wheezing 18 g 0    valACYclovir (VALTREX) 500 MG tablet Take 1 tablet by mouth daily         Allergies   No Known Allergies    Physical Exam   Patient Vitals for the past 24 hrs:   Temp Pulse Resp BP SpO2   10/14/23 2301 -- 89 -- (!) 118/96 --   10/14/23 2037 98.6 F (37 C) (!) 102 20 (!) 138/94 99 %     Physical Exam  Vitals and nursing note reviewed.   HENT:      Head: Normocephalic.      Nose: Nose normal.      Mouth/Throat:      Mouth: Mucous membranes are moist.      Comments: There is posterior oropharyngeal erythema bilaterally, uvula is midline there is no stridor or trismus there are no petechiae no exudate no lesions, there is no sublingual edema  Eyes:      Conjunctiva/sclera: Conjunctivae normal.      Pupils: Pupils are equal, round, and reactive to light.   Cardiovascular:      Rate and Rhythm: Normal rate and regular rhythm.      Pulses: Normal pulses.   Pulmonary:      Effort: Pulmonary effort is normal.   Abdominal:      General: Bowel sounds are normal. There is no distension.      Palpations: Abdomen is soft.      Tenderness: There is no abdominal tenderness.   Musculoskeletal:         General: Normal range of motion.  Cervical back: Normal range of motion and neck supple.   Skin:     General: Skin is warm and dry.      Capillary Refill: Capillary refill takes less than 2 seconds.   Neurological:      General: No focal deficit present.      Mental Status: She is alert and oriented to person, place, and time.          Impression and Management Plan    22 year old female presenting with fever sore throat and headache since Friday, who also had severe preeclampsia in third trimester and delivered October 14, who has not taken her blood pressure medication since Friday.  Differential diagnoses strep throat, viral pharyngitis, viral etiology such as COVID flu RSV, other infectious etiology including urinary tract infection, endometritis, preeclampsia    Diagnostic Studies   Lab:   Results for orders placed or performed during the hospital encounter of 10/14/23   COVID-19, Flu A/B, and RSV Combo    Specimen: Nasopharyngeal Swab   Result Value Ref Range    SARS-CoV-2 NEGATIVE NEGATIVE      Influenza A H1 (Seasonal) PCR NEGATIVE NEGATIVE      Influenza virus B RNA NEGATIVE NEGATIVE      RSV A/B PCR NEGATIVE NEGATIVE      Narrative    Pregnant?->Yes   Strep Screen By PCR Group A    Specimen: Throat   Result Value Ref Range    Strep A Ag Positive - Streptococcus Group A DNA was Detected. (A) Negative - No Streptococcus Group A DNA was Detected.     CBC with Auto Differential   Result Value Ref Range    WBC 14.5 (H) 4.0 - 11.0 1000/mm3    RBC 4.23 3.60 - 5.20 M/uL    Hemoglobin 10.9 (L) 11.0 - 16.0 gm/dl    Hematocrit 40.9 81.1 - 47.0 %    MCV 82.7 80.0 - 98.0 fL    MCH 25.8 25.4 - 34.6 pg    MCHC 31.1 30.0 - 36.0 gm/dl    Platelets 914 782 - 450 1000/mm3    MPV 12.0 (H) 6.0 - 10.0 fL    RDW 43.8 36.4 - 46.3      Nucleated RBCs 0 0 - 0      Immature Granulocytes % 0.3 0.0 - 3.0 %    Neutrophils Segmented 78.8 (H) 34 - 64 %    Lymphocytes 11.8 (L) 28 - 48 %    Monocytes 7.9 1 - 13 %    Eosinophils 1.1 0 - 5 %    Basophils 0.1 0 - 3 %   CMP   Result Value Ref Range    Potassium 4.0 3.5 - 5.1 mEq/L    Chloride 103 98 - 107 mEq/L    Sodium 138 136 - 145 mEq/L    CO2 29 20 - 31 mEq/L    Glucose 105 74 - 106 mg/dl    BUN 5 (L) 9 - 23 mg/dl    Creatinine 9.56 2.13 - 1.02 mg/dl    GFR African American >60.0      GFR Non-African American >60      Calcium 9.6 8.7 - 10.4 mg/dl     Anion Gap 6 5 - 15 mmol/L    AST 15.0 0.0 - 33.9 U/L    ALT 9 (L) 10 - 49 U/L    Alkaline Phosphatase 119 (H) 46 - 116 U/L    Total Bilirubin 0.50 0.30 - 1.20  mg/dl    Total Protein 7.3 5.7 - 8.2 gm/dl    Albumin 3.3 (L) 3.4 - 5.0 gm/dl   Microscopic Urinalysis   Result Value Ref Range    Squam Epithel, UA 30-49 (A) NEGATIVE,OCCASIONAL,1-4,5-9,10-14,15-29 /LPF    WBC, UA 15-29 (A) NEGATIVE /HPF    RBC, UA OCCASIONAL (A) NEGATIVE /HPF    BACTERIA, URINE 1+ (A) NEGATIVE /HPF   POCT Urinalysis no Micro   Result Value Ref Range    Glucose, Ur Negative NEGATIVE,Negative mg/dl    Bilirubin, Urine Negative NEGATIVE,Negative      Ketones, Urine Negative NEGATIVE,Negative mg/dl    Specific Gravity, UA 1.010 1.005 - 1.030      Blood, Urine Trace-intact (A) NEGATIVE,Negative      pH, Urine 7.0 5 - 9      Protein, Urine Negative NEGATIVE,Negative mg/dl    Urobilinogen, Urine 0.2 0.0 - 1.0 EU/dl    Nitrite, Urine Negative NEGATIVE,Negative      Leukocyte Esterase, Urine Large (A) NEGATIVE,Negative      Color, UA Yellow      Clarity, UA Slightly Cloudy     POC Pregnancy Urine Qual   Result Value Ref Range    Pregnancy, Urine negative NEGATIVE,Negative,negative              Other studies:  My interpretation of other studies is that they show, among other things, she is positive for strep throat, negative for COVID flu and RSV, her LFTs have trended downward, she has a contaminated urine sample, 1+ bacteria I do not think this is urinary tract infection, her pregnancy test is negative, she has leukocytosis which I think is consistent with her strep throat, and a mild anemia however this is trending upward from discharge see HPI.  ED Course         EXTERNAL RECORDS REVIEWED:  I reviewed the patient's previous records here at [facility] and available outside facilities and note that see HPI    PREVIOUS RESULTS REVIEWED: See HPI    INTERPRETATION OF TESTS: See above        Severe exacerbation or progression of chronic illness: Elevated blood  pressure,      CONDITION POSING Threat to body FUNCTION, LIFE, OR LIMB without evaluation and management: Preeclampsia, elevated blood pressure      SOCIAL DETERMINANTS  impacting Evaluation and Management: Patient visiting from out of town, has not taken her blood pressure medication since Friday      Comorbidities impacting Evaluation and Management: Preeclampsia          Medications   labetalol (NORMODYNE) tablet 200 mg (200 mg Oral Given 10/14/23 2150)   metoclopramide (REGLAN) tablet 10 mg (10 mg Oral Given 10/14/23 2150)   amoxicillin (AMOXIL) capsule 500 mg (500 mg Oral Given 10/14/23 2242)           Medical Decision Making   22 year old female with fever, sore throat, and headache.  LFTs are normal, blood pressure trending downward after she received her normal blood pressure medication that she had not taken since Friday, I do not think she is preeclamptic.  Her headache improved with Reglan and her blood pressure medication, and she is given amoxicillin with prescription for her strep throat.  She is stable, she has no concerning peripheral edema chest pain, shortness of breath, or other symptoms, she is given return precautions that she verbalized understanding and she is discharged in stable condition with sober ride home.    Final Diagnosis  ICD-10-CM    1. Strep pharyngitis  J02.0       2. Nonintractable headache, unspecified chronicity pattern, unspecified headache type  R51.9            Disposition   Discharged    The patient was personally evaluated by myself and discussed with Dr. Sherian Maroon, Albertha Ghee, MD who agrees with the above assessment and plan.    Brett Canales, PA-C  October 14, 2023    My signature above authenticates this document and my orders, the final    diagnosis (es), discharge prescription (s), and instructions in the Epic    record.  If you have any questions please contact [phone].     Nursing notes have been reviewed by the physician/ advanced practice     Clinician.                             Susanne Borders, PA-C  10/14/23 2311

## 2023-10-14 NOTE — ED Notes (Signed)
Pt dc by provider     Marinus Maw, LPN  16/10/96 [id number]

## 2023-10-14 NOTE — ED Triage Notes (Addendum)
Pt presents to triage c/o fever, hypertension and sore throat x 3 days.    Pt is 3 weeks post uncomplicated vaginal delivery, states that she was concerned about her BP as she was diagnosed with preecampsia and gestational HTN.    [facility] had [facility]ab done and referred to ER.

## 2023-10-14 NOTE — Discharge Instructions (Signed)
Take the amoxicillin twice daily until completely gone, you can take Tylenol treating ibuprofen productions about for pain and fever, take your blood pressure medication, if you do not have it with you I have prescribed to the pharmacy here.  See the results for testing below.  Return if you have severe or worsening condition.    Recent Results (from the past 24 hour(s))   COVID-19, Flu A/B, and RSV Combo    Collection Time: 10/14/23  9:15 PM    Specimen: Nasopharyngeal Swab   Result Value Ref Range    SARS-CoV-2 NEGATIVE NEGATIVE      Influenza A H1 (Seasonal) PCR NEGATIVE NEGATIVE      Influenza virus B RNA NEGATIVE NEGATIVE      RSV A/B PCR NEGATIVE NEGATIVE     Strep Screen By PCR Group A    Collection Time: 10/14/23  9:15 PM    Specimen: Throat   Result Value Ref Range    Strep A Ag Positive - Streptococcus Group A DNA was Detected. (A) Negative - No Streptococcus Group A DNA was Detected.     CBC with Auto Differential    Collection Time: 10/14/23  9:15 PM   Result Value Ref Range    WBC 14.5 (H) 4.0 - 11.0 1000/mm3    RBC 4.23 3.60 - 5.20 M/uL    Hemoglobin 10.9 (L) 11.0 - 16.0 gm/dl    Hematocrit 78.2 95.6 - 47.0 %    MCV 82.7 80.0 - 98.0 fL    MCH 25.8 25.4 - 34.6 pg    MCHC 31.1 30.0 - 36.0 gm/dl    Platelets 213 086 - 450 1000/mm3    MPV 12.0 (H) 6.0 - 10.0 fL    RDW 43.8 36.4 - 46.3      Nucleated RBCs 0 0 - 0      Immature Granulocytes % 0.3 0.0 - 3.0 %    Neutrophils Segmented 78.8 (H) 34 - 64 %    Lymphocytes 11.8 (L) 28 - 48 %    Monocytes 7.9 1 - 13 %    Eosinophils 1.1 0 - 5 %    Basophils 0.1 0 - 3 %   CMP    Collection Time: 10/14/23  9:15 PM   Result Value Ref Range    Potassium 4.0 3.5 - 5.1 mEq/L    Chloride 103 98 - 107 mEq/L    Sodium 138 136 - 145 mEq/L    CO2 29 20 - 31 mEq/L    Glucose 105 74 - 106 mg/dl    BUN 5 (L) 9 - 23 mg/dl    Creatinine 5.78 4.69 - 1.02 mg/dl    GFR African American >60.0      GFR Non-African American >60      Calcium 9.6 8.7 - 10.4 mg/dl    Anion Gap 6 5 - 15  mmol/L    AST 15.0 0.0 - 33.9 U/L    ALT 9 (L) 10 - 49 U/L    Alkaline Phosphatase 119 (H) 46 - 116 U/L    Total Bilirubin 0.50 0.30 - 1.20 mg/dl    Total Protein 7.3 5.7 - 8.2 gm/dl    Albumin 3.3 (L) 3.4 - 5.0 gm/dl   POCT Urinalysis no Micro    Collection Time: 10/14/23  9:39 PM   Result Value Ref Range    Glucose, Ur Negative NEGATIVE,Negative mg/dl    Bilirubin, Urine Negative NEGATIVE,Negative      Ketones, Urine Negative NEGATIVE,Negative  mg/dl    Specific Gravity, UA 1.010 1.005 - 1.030      Blood, Urine Trace-intact (A) NEGATIVE,Negative      pH, Urine 7.0 5 - 9      Protein, Urine Negative NEGATIVE,Negative mg/dl    Urobilinogen, Urine 0.2 0.0 - 1.0 EU/dl    Nitrite, Urine Negative NEGATIVE,Negative      Leukocyte Esterase, Urine Large (A) NEGATIVE,Negative      Color, UA Yellow      Clarity, UA Slightly Cloudy     Microscopic Urinalysis    Collection Time: 10/14/23  9:39 PM   Result Value Ref Range    Squam Epithel, UA 30-49 (A) NEGATIVE,OCCASIONAL,1-4,5-9,10-14,15-29 /LPF    WBC, UA 15-29 (A) NEGATIVE /HPF    RBC, UA OCCASIONAL (A) NEGATIVE /HPF    BACTERIA, URINE 1+ (A) NEGATIVE /HPF   POC Pregnancy Urine Qual    Collection Time: 10/14/23  9:40 PM   Result Value Ref Range    Pregnancy, Urine negative NEGATIVE,Negative,negative

## 2023-10-14 NOTE — ED Notes (Cosign Needed)
Labs  Patient is not pregnant.  She has a urine culture that shows 50,000 strep B.  This will not be treated since it is under 100,000 and she is not pregnant.  She does have strep and was treated appropriately     Dyanne Iha, LPN  91/47/82 [id number]

## 2023-10-15 ENCOUNTER — Inpatient Hospital Stay
Admit: 2023-10-15 | Discharge: 2023-10-15 | Disposition: A | Discharge status: Home / Self Care | Payer: PRIVATE HEALTH INSURANCE | Attending: Emergency Medicine

## 2023-10-15 LAB — COMPREHENSIVE METABOLIC PANEL
ALT: 9 U/L — ABNORMAL LOW (ref 10–49)
AST: 15 U/L (ref 0.0–33.9)
Albumin: 3.3 g/dL — ABNORMAL LOW (ref 3.4–5.0)
Alkaline Phosphatase: 119 U/L — ABNORMAL HIGH (ref 46–116)
Anion Gap: 6 mmol/L (ref 5–15)
BUN: 5 mg/dL — ABNORMAL LOW (ref 9–23)
CO2: 29 meq/L (ref 20–31)
Calcium: 9.6 mg/dL (ref 8.7–10.4)
Chloride: 103 meq/L (ref 98–107)
Creatinine: 0.85 mg/dL (ref 0.55–1.02)
GFR African American: >60
GFR Non-African American: >60
Glucose: 105 mg/dL (ref 74–106)
Potassium: 4 meq/L (ref 3.5–5.1)
Sodium: 138 meq/L (ref 136–145)
Total Bilirubin: 0.5 mg/dL (ref 0.30–1.20)
Total Protein: 7.3 g/dL (ref 5.7–8.2)

## 2023-10-15 LAB — POC PREGNANCY UR-QUAL: Pregnancy, Urine: NEGATIVE

## 2023-10-15 LAB — CBC WITH AUTO DIFFERENTIAL
Basophils: 0.1 % (ref 0–3)
Eosinophils: 1.1 % (ref 0–5)
Hematocrit: 35 % (ref 35.0–47.0)
Hemoglobin: 10.9 g/dL — ABNORMAL LOW (ref 11.0–16.0)
Immature Granulocytes %: 0.3 % (ref 0.0–3.0)
Lymphocytes: 11.8 % — ABNORMAL LOW (ref 28–48)
MCH: 25.8 pg (ref 25.4–34.6)
MCHC: 31.1 g/dL (ref 30.0–36.0)
MCV: 82.7 fL (ref 80.0–98.0)
MPV: 12 fL — ABNORMAL HIGH (ref 6.0–10.0)
Monocytes: 7.9 % (ref 1–13)
Neutrophils Segmented: 78.8 % — ABNORMAL HIGH (ref 34–64)
Nucleated RBCs: 0 (ref 0–0)
Platelets: 356 10*3/uL (ref 140–450)
RBC: 4.23 M/uL (ref 3.60–5.20)
RDW: 43.8 (ref 36.4–46.3)
WBC: 14.5 10*3/uL — ABNORMAL HIGH (ref 4.0–11.0)

## 2023-10-15 LAB — POCT URINALYSIS DIPSTICK
Bilirubin, Urine: NEGATIVE
Glucose, Ur: NEGATIVE mg/dL
Ketones, Urine: NEGATIVE mg/dL
Nitrite, Urine: NEGATIVE
Protein, Urine: NEGATIVE mg/dL
Specific Gravity, UA: 1.01 (ref 1.005–1.030)
Urobilinogen, Urine: 0.2 U/dL (ref 0.0–1.0)
pH, Urine: 7 (ref 5–9)

## 2023-10-15 LAB — MICROSCOPIC URINALYSIS

## 2023-10-15 LAB — COVID-19, FLU A/B, AND RSV COMBO
Influenza A H1 (Seasonal) PCR: NEGATIVE
Influenza virus B RNA: NEGATIVE
RSV A/B PCR: NEGATIVE
SARS-CoV-2: NEGATIVE

## 2023-10-15 LAB — GROUP A STREP SCREEN BY PCR: Strep A Ag: POSITIVE — AB

## 2023-10-15 MED ORDER — LABETALOL HCL 200 MG PO TABS
200 | ORAL_TABLET | Freq: Two times a day (BID) | ORAL | 3 refills | Status: AC
Start: 2023-10-15 — End: ?

## 2023-10-15 MED ORDER — AMOXICILLIN 250 MG PO CAPS
250 | ORAL | Status: AC
Start: 2023-10-15 — End: 2023-10-14
  Administered 2023-10-15: 04:00:00 500 mg via ORAL

## 2023-10-15 MED ORDER — AMOXICILLIN 500 MG PO CAPS
500 | ORAL_CAPSULE | Freq: Two times a day (BID) | ORAL | 0 refills | Status: AC
Start: 2023-10-15 — End: 2023-10-24

## 2023-10-15 MED ORDER — LABETALOL HCL 100 MG PO TABS
100 | ORAL | Status: AC
Start: 2023-10-15 — End: 2023-10-14
  Administered 2023-10-15: 03:00:00 200 mg via ORAL

## 2023-10-15 MED ORDER — METOCLOPRAMIDE HCL 10 MG PO TABS
10 | ORAL | Status: AC
Start: 2023-10-15 — End: 2023-10-14
  Administered 2023-10-15: 03:00:00 10 mg via ORAL

## 2023-10-15 MED FILL — METOCLOPRAMIDE HCL 10 MG PO TABS: 10 MG | ORAL | Qty: 1

## 2023-10-15 MED FILL — AMOXICILLIN 250 MG PO CAPS: 250 MG | ORAL | Qty: 2

## 2023-10-15 MED FILL — LABETALOL HCL 100 MG PO TABS: 100 MG | ORAL | Qty: 2

## 2023-10-16 LAB — CULTURE, URINE
Culture Result: 60000 — AB
Isolate: 50000 — AB
